# Patient Record
Sex: Female | Born: 1990 | Race: Black or African American | Hispanic: No | Marital: Single | Smoking: Never smoker
Health system: Southern US, Community
[De-identification: ages and names within clinical notes are randomized; demographics above are authoritative.]

## PROBLEM LIST (undated history)

## (undated) ENCOUNTER — Inpatient Hospital Stay (HOSPITAL_COMMUNITY): Payer: Self-pay

## (undated) DIAGNOSIS — A599 Trichomoniasis, unspecified: Secondary | ICD-10-CM

## (undated) DIAGNOSIS — A749 Chlamydial infection, unspecified: Secondary | ICD-10-CM

## (undated) HISTORY — PX: NO PAST SURGERIES: SHX2092

---

## 2011-02-09 ENCOUNTER — Emergency Department (HOSPITAL_COMMUNITY): Payer: Self-pay

## 2011-02-09 ENCOUNTER — Encounter (HOSPITAL_COMMUNITY): Payer: Self-pay | Admitting: *Deleted

## 2011-02-09 ENCOUNTER — Emergency Department (HOSPITAL_COMMUNITY)
Admission: EM | Admit: 2011-02-09 | Discharge: 2011-02-09 | Disposition: A | Discharge status: Home / Self Care | Payer: Self-pay | Attending: Emergency Medicine | Admitting: Emergency Medicine

## 2011-02-09 DIAGNOSIS — O200 Threatened abortion: Secondary | ICD-10-CM | POA: Insufficient documentation

## 2011-02-09 DIAGNOSIS — R109 Unspecified abdominal pain: Secondary | ICD-10-CM | POA: Insufficient documentation

## 2011-02-09 LAB — WET PREP, GENITAL
Clue Cells Wet Prep HPF POC: NONE SEEN
Yeast Wet Prep HPF POC: NONE SEEN

## 2011-02-09 LAB — POCT I-STAT, CHEM 8
BUN: 7 mg/dL (ref 6–23)
Creatinine, Ser: 0.7 mg/dL (ref 0.50–1.10)
Potassium: 3.7 mEq/L (ref 3.5–5.1)
Sodium: 141 mEq/L (ref 135–145)

## 2011-02-09 LAB — URINALYSIS, ROUTINE W REFLEX MICROSCOPIC
Specific Gravity, Urine: 1.031 — ABNORMAL HIGH (ref 1.005–1.030)
Urobilinogen, UA: 1 mg/dL (ref 0.0–1.0)

## 2011-02-09 LAB — URINE MICROSCOPIC-ADD ON

## 2011-02-09 LAB — POCT PREGNANCY, URINE: Preg Test, Ur: POSITIVE — AB

## 2011-02-09 MED ORDER — HYDROCODONE-ACETAMINOPHEN 5-500 MG PO TABS: 1.0000 | ORAL_TABLET | Freq: Four times a day (QID) | ORAL | Status: DC | PRN

## 2011-02-09 NOTE — ED Provider Notes (Signed)
Medical screening examination/treatment/procedure(s) were performed by non-physician practitioner and as supervising physician I was immediately available for consultation/collaboration.   Gerhard Munch, MD 02/09/11 [phone]

## 2011-02-09 NOTE — ED Notes (Signed)
Pt had her last period in early January and then found out that she was pregnant last week by home pregnancy test.  This morning she woke up bleding vaginally, heavier than a period and she was having some menstrual type cramping.  Pt states that now the bleeding is like a regular period.  Pt is in no distress in triage

## 2011-02-09 NOTE — ED Notes (Signed)
NP in for pelvic exam

## 2011-02-09 NOTE — ED Notes (Signed)
Pelvic cart to bedside 

## 2011-02-09 NOTE — ED Provider Notes (Signed)
History     CSN: [id number]  Arrival date & time 02/09/11  1448   First MD Initiated Contact with Patient 02/09/11 1622      Chief Complaint  Patient presents with  . Vaginal Bleeding    (Consider location/radiation/quality/duration/timing/severity/associated sxs/prior treatment) Patient is a 21 y.o. female presenting with vaginal bleeding. The history is provided by the patient.  Vaginal Bleeding This is a new problem. The current episode started today. The problem has been gradually improving. Associated symptoms include abdominal pain. Pertinent negatives include no chills, diaphoresis, fever, nausea, urinary symptoms, vomiting or weakness.  Pt states she did have a period at the beginning of the month, which she states was unusual. States she took a pregnancy test last week, and it was positive. This morning when woke up, she had a large amount of vaginal bleeding and clot like products. States heavier than normal period. States also had lower abdominal cramping. Denies prior pregnancies. States pain now completely resolved, bleeding is still present but slowing down. Denies fever, nausea, vomiting, dizziness.  No past medical history on file.  No past surgical history on file.  No family history on file.  History  Substance Use Topics  . Smoking status: Not on file  . Smokeless tobacco: Not on file  . Alcohol Use: Not on file    OB History    No data available      Review of Systems  Constitutional: Negative for fever, chills and diaphoresis.  HENT: Negative.   Eyes: Negative.   Respiratory: Negative.   Gastrointestinal: Positive for abdominal pain. Negative for nausea, vomiting, diarrhea and constipation.  Genitourinary: Positive for vaginal bleeding and pelvic pain. Negative for dysuria and vaginal pain.  Musculoskeletal: Negative.   Skin: Negative.   Neurological: Negative.  Negative for weakness.  Hematological: Negative.   Psychiatric/Behavioral: Negative.       Allergies  Review of patient's allergies indicates no known allergies.  Home Medications  No current outpatient prescriptions on file.  BP 117/55  Pulse 70  Temp(Src) 98 F (36.7 C) (Oral)  Resp 20  Ht 5' 5.5" (1.664 m)  Wt 132 lb (59.875 kg)  BMI 21.63 kg/m2  SpO2 100%  LMP 01/18/2010  Physical Exam  Nursing note and vitals reviewed. Constitutional: She is oriented to person, place, and time. She appears well-developed and well-nourished. No distress.  HENT:  Head: Normocephalic and atraumatic.  Eyes: Conjunctivae are normal.  Neck: Neck supple.  Cardiovascular: Normal rate and normal heart sounds.   Pulmonary/Chest: Effort normal and breath sounds normal. No respiratory distress.  Abdominal: Soft. Bowel sounds are normal. She exhibits no distension. There is no tenderness.  Genitourinary:       Normal external genetalia. Blood clot  About 5cm in diameter in vaginal canal, moderate bleeding. Cervix closed. Uterus tender to palpation, no adnexal tenderness, no adnexa masses  Musculoskeletal: Normal range of motion. She exhibits no edema.  Neurological: She is alert and oriented to person, place, and time.  Skin: Skin is warm and dry.  Psychiatric: She has a normal mood and affect.    ED Course  Procedures (including critical care time) 5:22 PM Pt with vag bleeding, positive pregnancy. Pelvic exam showed bleeding, clots, cervix closed, no producs of conception. Will get [place] for further evaluation. Pt in NAD. VS normal.   Results for orders placed during the hospital encounter of 02/09/11  URINALYSIS, ROUTINE W REFLEX MICROSCOPIC      Component Value Range   Color, Urine  YELLOW  YELLOW    APPearance CLEAR  CLEAR    Specific Gravity, Urine 1.031 (*) 1.005 - 1.030    pH 5.5  5.0 - 8.0    Glucose, UA NEGATIVE  NEGATIVE (mg/dL)   Hgb urine dipstick LARGE (*) NEGATIVE    Bilirubin Urine SMALL (*) NEGATIVE    Ketones, ur 15 (*) NEGATIVE (mg/dL)   Protein, ur >308 (*)  NEGATIVE (mg/dL)   Urobilinogen, UA 1.0  0.0 - 1.0 (mg/dL)   Nitrite NEGATIVE  NEGATIVE    Leukocytes, UA NEGATIVE  NEGATIVE   WET PREP, GENITAL      Component Value Range   Yeast, Wet Prep NONE SEEN  NONE SEEN    Trich, Wet Prep NONE SEEN  NONE SEEN    Clue Cells, Wet Prep NONE SEEN  NONE SEEN    WBC, Wet Prep HPF POC FEW (*) NONE SEEN   HCG, QUANTITATIVE, PREGNANCY      Component Value Range   hCG, Beta Chain, Quant, S 675 (*) <5 (mIU/mL)  URINE MICROSCOPIC-ADD ON      Component Value Range   Squamous Epithelial / LPF FEW (*) RARE    WBC, UA 0-2  <3 (WBC/hpf)   RBC / HPF 21-50  <3 (RBC/hpf)   Bacteria, UA RARE  RARE    Urine-Other MUCOUS PRESENT    POCT I-STAT, CHEM 8      Component Value Range   Sodium 141  135 - 145 (mEq/L)   Potassium 3.7  3.5 - 5.1 (mEq/L)   Chloride 106  96 - 112 (mEq/L)   BUN 7  6 - 23 (mg/dL)   Creatinine, Ser 6.57  0.50 - 1.10 (mg/dL)   Glucose, Bld 92  70 - 99 (mg/dL)   Calcium, Ion 8.46  9.62 - 1.32 (mmol/L)   TCO2 25  0 - 100 (mmol/L)   Hemoglobin 12.9  12.0 - 15.0 (g/dL)   HCT 95.2  84.1 - 32.4 (%)  ABO/RH      Component Value Range   ABO/RH(D) B POS    POCT PREGNANCY, URINE      Component Value Range   Preg Test, Ur POSITIVE (*) NEGATIVE    US Ob Comp Less 14 Wks  02/09/2011  *RADIOLOGY REPORT*  Clinical Data: Pregnant patient with pain.  OBSTETRIC <14 WK [place] AND TRANSVAGINAL OB US  Technique:  Both transabdominal and transvaginal ultrasound examinations were performed for complete evaluation of the gestation as well as the maternal uterus, adnexal regions, and pelvic cul-de-sac.  Transvaginal technique was performed to assess early pregnancy.  Comparison:  None.  Intrauterine gestational sac:  There is a large volume of echogenic material within the endometrial canal consistent with hemorrhage. In the lower uterine segment, there appears to be a more focal area of increased attenuation which may represent a gestational sac.  No evidence of  viable intrauterine pregnancy is identified.  Maternal uterus/adnexae: The right ovary measures 4.1 by 3.8 x 2.1 cm.  A focal collection within the right ovary measures 2.4 x 1.4 x 2.1 cm.  The left ovary measures 4.8 x 3.6 x 3.7 cm.  There is a focal lesion within the left ovary measuring 2.7 x 3.9 by 3.2 cm with multiple lacy internal echoes. There is a small volume of free pelvic fluid.  IMPRESSION:  1.  Findings most consistent with abortion in progress with a large volume of clot seen in the uterus. Early intrauterine pregnancy is identified. 2.  Cystic lesion  in the left ovary has an appearance consistent with a hemorrhagic cyst. 3.  Nonspecific lesion in the right ovary may also be a hemorrhagic cyst.  Repeat ultrasound in 6-8 weeks is recommended for further evaluation.  Original Report Authenticated By: Bernadene Bell. D'ALESSIO, M.D.   US Ob Transvaginal  02/09/2011  *RADIOLOGY REPORT*  Clinical Data: Pregnant patient with pain.  OBSTETRIC <14 WK [place] AND TRANSVAGINAL OB US  Technique:  Both transabdominal and transvaginal ultrasound examinations were performed for complete evaluation of the gestation as well as the maternal uterus, adnexal regions, and pelvic cul-de-sac.  Transvaginal technique was performed to assess early pregnancy.  Comparison:  None.  Intrauterine gestational sac:  There is a large volume of echogenic material within the endometrial canal consistent with hemorrhage. In the lower uterine segment, there appears to be a more focal area of increased attenuation which may represent a gestational sac.  No evidence of viable intrauterine pregnancy is identified.  Maternal uterus/adnexae: The right ovary measures 4.1 by 3.8 x 2.1 cm.  A focal collection within the right ovary measures 2.4 x 1.4 x 2.1 cm.  The left ovary measures 4.8 x 3.6 x 3.7 cm.  There is a focal lesion within the left ovary measuring 2.7 x 3.9 by 3.2 cm with multiple lacy internal echoes. There is a small volume of free pelvic  fluid.  IMPRESSION:  1.  Findings most consistent with abortion in progress with a large volume of clot seen in the uterus. Early intrauterine pregnancy is identified. 2.  Cystic lesion in the left ovary has an appearance consistent with a hemorrhagic cyst. 3.  Nonspecific lesion in the right ovary may also be a hemorrhagic cyst.  Repeat ultrasound in 6-8 weeks is recommended for further evaluation.  Original Report Authenticated By: Bernadene Bell. D'ALESSIO, M.D.     6:49 PM Pt with possible threatened abortion. Spoke with Dr. Billy Coast, recommended adding Type and RH, and following up on Monday at [facility] for HCG recheck. Pt is currently stable, with normal vs, nad, no abdominal pain at present. OK to d/c home.  8:35 PM Blood type B pos, no rhogam given. Will d/c home with close follow up.   No diagnosis found.    MDM          Lottie Mussel, PA 02/09/11 2035

## 2011-02-09 NOTE — ED Notes (Signed)
Patient was given written d/c instruction. Left ambulatory steady gait with family/friends.  Understands follow up instruction

## 2011-02-10 LAB — GC/CHLAMYDIA PROBE AMP, GENITAL: Chlamydia, DNA Probe: NEGATIVE

## 2011-02-10 MED ORDER — LIDOCAINE HCL (CARDIAC) 20 MG/ML IV SOLN
INTRAVENOUS | Status: AC
  Filled 2011-02-10: qty 5

## 2011-02-10 MED ORDER — ETOMIDATE 2 MG/ML IV SOLN
INTRAVENOUS | Status: AC
  Filled 2011-02-10: qty 20

## 2011-02-10 MED ORDER — SUCCINYLCHOLINE CHLORIDE 20 MG/ML IJ SOLN
INTRAMUSCULAR | Status: AC
  Filled 2011-02-10: qty 10

## 2011-02-10 MED ORDER — ROCURONIUM BROMIDE 50 MG/5ML IV SOLN
INTRAVENOUS | Status: AC
  Filled 2011-02-10: qty 2

## 2011-02-13 ENCOUNTER — Inpatient Hospital Stay (HOSPITAL_COMMUNITY)
Admission: AD | Admit: 2011-02-13 | Discharge: 2011-02-13 | Disposition: A | Discharge status: Home / Self Care | Payer: Self-pay | Source: Ambulatory Visit | Attending: Obstetrics & Gynecology | Admitting: Obstetrics & Gynecology

## 2011-02-13 ENCOUNTER — Telehealth: Payer: Self-pay | Admitting: *Deleted

## 2011-02-13 ENCOUNTER — Encounter (HOSPITAL_COMMUNITY): Payer: Self-pay

## 2011-02-13 DIAGNOSIS — O039 Complete or unspecified spontaneous abortion without complication: Secondary | ICD-10-CM | POA: Insufficient documentation

## 2011-02-13 HISTORY — DX: Chlamydial infection, unspecified: A74.9

## 2011-02-13 HISTORY — DX: Trichomoniasis, unspecified: A59.9

## 2011-02-13 LAB — HCG, QUANTITATIVE, PREGNANCY: hCG, Beta Chain, Quant, S: 120 m[IU]/mL — ABNORMAL HIGH (ref <5)

## 2011-02-13 MED ORDER — IBUPROFEN 600 MG PO TABS: 600.0000 mg | ORAL_TABLET | Freq: Four times a day (QID) | ORAL | Status: AC | PRN

## 2011-02-13 NOTE — Progress Notes (Signed)
Patient states she was seen at [facility] ED on 1-24 and was instructed to follow up in [facility] if bleeding continued. States she is having a scant amount of brownish bleeding but no pain.

## 2011-02-13 NOTE — Telephone Encounter (Signed)
Spoke with patient. She is coming on 02/27/11 for lab at 3 pm.

## 2011-02-13 NOTE — ED Provider Notes (Signed)
History    G1 at 4 weeks by LMP presents to [facility] with brown spotting when she wipes.  She presented to [facility] with bright red vaginal bleeding on 02/09/11 and had a PPT and had u/s at that time showing possible miscarriage.  She was told to come to women's in a few days for f/u.   Chief Complaint  Patient presents with  . Follow-up   HPI  OB History    Grav Para Term Preterm Abortions TAB SAB Ect Mult Living   1               No past medical history on file.  No past surgical history on file.  No family history on file.  History  Substance Use Topics  . Smoking status: Never Smoker   . Smokeless tobacco: Not on file  . Alcohol Use: No    Allergies: No Known Allergies  Prescriptions prior to admission  Medication Sig Dispense Refill  . HYDROcodone-acetaminophen (VICODIN) 5-500 MG per tablet Take 1-2 tablets by mouth every 6 (six) hours as needed for pain.  15 tablet  0    Review of Systems  Constitutional: Negative for malaise/fatigue.  Gastrointestinal: Positive for abdominal pain.  Neurological: Negative for dizziness.   Physical Exam   Blood pressure 116/63, pulse 63, temperature 97.9 F (36.6 C), temperature source Oral, resp. rate 16, height 5' 3.75" (1.619 m), weight 60.963 kg (134 lb 6.4 oz), last menstrual period 01/18/2010, SpO2 97.00%.  Physical Exam  Constitutional: She is oriented to person, place, and time. She appears well-developed and well-nourished.  Respiratory: Effort normal.  GI: Soft.  Neurological: She is alert and oriented to person, place, and time. She has normal reflexes.  Skin: Skin is warm and dry.  Psychiatric: She has a normal mood and affect. Her behavior is normal. Judgment and thought content normal.   Results for recent visit to [facility] and today's visit:   Recent Results (from the past 168 hour(s))  URINALYSIS, ROUTINE W REFLEX MICROSCOPIC   Collection Time   02/09/11  4:49 PM      Component Value Range   Color, Urine  YELLOW  YELLOW    APPearance CLEAR  CLEAR    Specific Gravity, Urine 1.031 (*) 1.005 - 1.030    pH 5.5  5.0 - 8.0    Glucose, UA NEGATIVE  NEGATIVE (mg/dL)   Hgb urine dipstick LARGE (*) NEGATIVE    Bilirubin Urine SMALL (*) NEGATIVE    Ketones, ur 15 (*) NEGATIVE (mg/dL)   Protein, ur >409 (*) NEGATIVE (mg/dL)   Urobilinogen, UA 1.0  0.0 - 1.0 (mg/dL)   Nitrite NEGATIVE  NEGATIVE    Leukocytes, UA NEGATIVE  NEGATIVE   URINE MICROSCOPIC-ADD ON   Collection Time   02/09/11  4:49 PM      Component Value Range   Squamous Epithelial / LPF FEW (*) RARE    WBC, UA 0-2  <3 (WBC/hpf)   RBC / HPF 21-50  <3 (RBC/hpf)   Bacteria, UA RARE  RARE    Urine-Other MUCOUS PRESENT    POCT PREGNANCY, URINE   Collection Time   02/09/11  4:55 PM      Component Value Range   Preg Test, Ur POSITIVE (*) NEGATIVE   GC/CHLAMYDIA PROBE AMP, GENITAL   Collection Time   02/09/11  5:14 PM      Component Value Range   GC Probe Amp, Genital NEGATIVE  NEGATIVE    Chlamydia, DNA  Probe NEGATIVE  NEGATIVE   WET PREP, GENITAL   Collection Time   02/09/11  5:14 PM      Component Value Range   Yeast, Wet Prep NONE SEEN  NONE SEEN    Trich, Wet Prep NONE SEEN  NONE SEEN    Clue Cells, Wet Prep NONE SEEN  NONE SEEN    WBC, Wet Prep HPF POC FEW (*) NONE SEEN   HCG, QUANTITATIVE, PREGNANCY   Collection Time   02/09/11  5:33 PM      Component Value Range   hCG, Beta Chain, Quant, S 675 (*) <5 (mIU/mL)  POCT I-STAT, CHEM 8   Collection Time   02/09/11  5:50 PM      Component Value Range   Sodium 141  135 - 145 (mEq/L)   Potassium 3.7  3.5 - 5.1 (mEq/L)   Chloride 106  96 - 112 (mEq/L)   BUN 7  6 - 23 (mg/dL)   Creatinine, Ser 1.61  0.50 - 1.10 (mg/dL)   Glucose, Bld 92  70 - 99 (mg/dL)   Calcium, Ion 0.96  0.45 - 1.32 (mmol/L)   TCO2 25  0 - 100 (mmol/L)   Hemoglobin 12.9  12.0 - 15.0 (g/dL)   HCT 40.9  81.1 - 91.4 (%)  ABO/RH   Collection Time   02/09/11  7:53 PM      Component Value Range   ABO/RH(D) B  POS    HCG, QUANTITATIVE, PREGNANCY   Collection Time   02/13/11 10:33 AM      Component Value Range   hCG, Beta Chain, Quant, S 120 (*) <5 (mIU/mL)    MAU Course  Procedures Quantitative hCG   Assessment and Plan  A: Probable spontaneous abortion in early pregnancy  P: D/C home with bleeding precautions Ibuprofen 600 mg PO Q6-8 hours for pain/bleeding Gyn clinic to contact pt with appointment in 2 weeks for f/u quantitative hCG  LEFTWICH-KIRBY, [place] 02/13/2011, 10:29 AM

## 2011-02-13 NOTE — Telephone Encounter (Signed)
Message copied by Mannie Stabile on Mon Feb 13, 2011  1:25 PM ------      Message from: LEFTWICH-KIRBY, LISA A      Created: Mon Feb 13, 2011 11:54 AM      Regarding: Needs f/u QhCG       Please call pt for appointment in 2 weeks for repeat quantitative hCG for probable miscarriage vs early ectopic.

## 2011-02-27 ENCOUNTER — Other Ambulatory Visit: Payer: Self-pay

## 2012-03-16 ENCOUNTER — Emergency Department (HOSPITAL_COMMUNITY)
Admission: EM | Admit: 2012-03-16 | Discharge: 2012-03-16 | Disposition: A | Discharge status: Other Acute Inpatient Hospital | Payer: Self-pay

## 2012-03-16 ENCOUNTER — Emergency Department (HOSPITAL_COMMUNITY)
Admission: EM | Admit: 2012-03-16 | Discharge: 2012-03-16 | Disposition: A | Discharge status: Home / Self Care | Payer: BC Managed Care – PPO | Attending: Emergency Medicine | Admitting: Emergency Medicine

## 2012-03-16 ENCOUNTER — Encounter (HOSPITAL_COMMUNITY): Payer: Self-pay | Admitting: Nurse Practitioner

## 2012-03-16 DIAGNOSIS — Z8619 Personal history of other infectious and parasitic diseases: Secondary | ICD-10-CM | POA: Insufficient documentation

## 2012-03-16 DIAGNOSIS — Z8709 Personal history of other diseases of the respiratory system: Secondary | ICD-10-CM | POA: Insufficient documentation

## 2012-03-16 DIAGNOSIS — O98919 Unspecified maternal infectious and parasitic disease complicating pregnancy, unspecified trimester: Secondary | ICD-10-CM | POA: Insufficient documentation

## 2012-03-16 DIAGNOSIS — O209 Hemorrhage in early pregnancy, unspecified: Secondary | ICD-10-CM | POA: Insufficient documentation

## 2012-03-16 DIAGNOSIS — N760 Acute vaginitis: Secondary | ICD-10-CM | POA: Insufficient documentation

## 2012-03-16 DIAGNOSIS — Z8742 Personal history of other diseases of the female genital tract: Secondary | ICD-10-CM | POA: Insufficient documentation

## 2012-03-16 DIAGNOSIS — B9689 Other specified bacterial agents as the cause of diseases classified elsewhere: Secondary | ICD-10-CM | POA: Insufficient documentation

## 2012-03-16 DIAGNOSIS — A499 Bacterial infection, unspecified: Secondary | ICD-10-CM | POA: Insufficient documentation

## 2012-03-16 DIAGNOSIS — Z79899 Other long term (current) drug therapy: Secondary | ICD-10-CM | POA: Insufficient documentation

## 2012-03-16 LAB — PREGNANCY, URINE: Preg Test, Ur: POSITIVE — AB

## 2012-03-16 LAB — URINE MICROSCOPIC-ADD ON

## 2012-03-16 LAB — URINALYSIS, ROUTINE W REFLEX MICROSCOPIC
Ketones, ur: 15 mg/dL — AB
Nitrite: NEGATIVE
Protein, ur: NEGATIVE mg/dL

## 2012-03-16 LAB — WET PREP, GENITAL: Yeast Wet Prep HPF POC: NONE SEEN

## 2012-03-16 MED ORDER — METRONIDAZOLE 500 MG PO TABS
500.0000 mg | ORAL_TABLET | Freq: Once | ORAL | Status: DC
  Filled 2012-03-16: qty 1

## 2012-03-16 MED ORDER — METRONIDAZOLE 500 MG PO TABS: 500.0000 mg | ORAL_TABLET | Freq: Two times a day (BID) | ORAL | Status: DC

## 2012-03-16 NOTE — ED Provider Notes (Signed)
History     CSN: [id number]  Arrival date & time 03/16/12  1411   First MD Initiated Contact with Patient 03/16/12 1506      Chief Complaint  Patient presents with  . Vaginal Discharge    (Consider location/radiation/quality/duration/timing/severity/associated sxs/prior treatment) HPI  The patient presents with concern of a vaginal discharge.  She had a positive pregnancy test 3 days ago, last menstrual period was one month ago.  She states approximately 3 days ago she started having vaginal bleeding, which has decreased in the interval days.  She continues to have mild discharge/brown blood, but no vaginal pain, no abdominal pain, no lightheadedness, nausea, no vomiting, no diarrhea. This is her third pregnancy.  No complete pregnancies thus far. She is generally healthy.  Past Medical History  Diagnosis Date  . Asthma     childhood  . Trichomonas   . Chlamydia     History reviewed. No pertinent past surgical history.  History reviewed. No pertinent family history.  History  Substance Use Topics  . Smoking status: Never Smoker   . Smokeless tobacco: Never Used  . Alcohol Use: 5.4 oz/week    5 Glasses of wine, 4 Shots of liquor per week     Comment: Every other day and on most weekends    OB History   Grav Para Term Preterm Abortions TAB SAB Ect Mult Living   1               Review of Systems  Constitutional:       Per HPI, otherwise negative  HENT:       Per HPI, otherwise negative  Respiratory:       Per HPI, otherwise negative  Cardiovascular:       Per HPI, otherwise negative  Gastrointestinal: Negative for vomiting.  Endocrine:       Negative aside from HPI  Genitourinary:       Neg aside from HPI   Musculoskeletal:       Per HPI, otherwise negative  Skin: Negative.   Neurological: Negative for syncope.    Allergies  Review of patient's allergies indicates no known allergies.  Home Medications   Current Outpatient Rx  Name  Route  Sig   Dispense  Refill  . ibuprofen (ADVIL,MOTRIN) 200 MG tablet   Oral   Take 400 mg by mouth every 6 (six) hours as needed for pain (for headache.).         Marland Kitchen norgestimate-ethinyl estradiol (ORTHO-CYCLEN,SPRINTEC,PREVIFEM) 0.25-35 MG-MCG tablet   Oral   Take 1 tablet by mouth daily.         Marland Kitchen PRESCRIPTION MEDICATION   Oral   Take 1 tablet by mouth.         . Pseudoephedrine-APAP-DM (DAYQUIL PO)   Oral   Take 2 capsules by mouth once.           BP 127/72  Pulse 67  Temp(Src) 98.7 F (37.1 C) (Oral)  Resp 16  SpO2 98%  Physical Exam  Nursing note and vitals reviewed. Constitutional: She is oriented to person, place, and time. She appears well-developed and well-nourished. No distress.  HENT:  Head: Normocephalic and atraumatic.  Eyes: Conjunctivae and EOM are normal.  Cardiovascular: Normal rate and regular rhythm.   Pulmonary/Chest: Effort normal and breath sounds normal. No stridor. No respiratory distress.  Abdominal: She exhibits no distension.  Musculoskeletal: She exhibits no edema.  Neurological: She is alert and oriented to person, place, and time. No cranial  nerve deficit.  Skin: Skin is warm and dry.  Psychiatric: She has a normal mood and affect.    ED Course  Pelvic exam Date/Time: 03/16/2012 3:58 PM Performed by: Gerhard Munch Authorized by: Gerhard Munch Consent: Verbal consent obtained. The procedure was performed in an emergent situation. Risks and benefits: risks, benefits and alternatives were discussed Consent given by: patient Patient understanding: patient states understanding of the procedure being performed Patient consent: the patient's understanding of the procedure matches consent given Procedure consent: procedure consent matches procedure scheduled Relevant documents: relevant documents present and verified Test results: test results available and properly labeled Site marked: the operative site was marked Imaging studies:  imaging studies available Required items: required blood products, implants, devices, and special equipment available Patient identity confirmed: verbally with patient Time out: Immediately prior to procedure a "time out" was called to verify the correct patient, procedure, equipment, support staff and site/side marked as required. Preparation: Patient was prepped and draped in the usual sterile fashion. Local anesthesia used: no Patient sedated: no Patient tolerance: Patient tolerated the procedure well with no immediate complications.   (including critical care time)  Labs Reviewed  PREGNANCY, URINE - Abnormal; Notable for the following:    Preg Test, Ur POSITIVE (*)    All other components within normal limits  WET PREP, GENITAL  GC/CHLAMYDIA PROBE AMP  URINALYSIS, ROUTINE W REFLEX MICROSCOPIC   No results found.   No diagnosis found.  6:25 PM After significant the leg, with some labs pending, the patient was discharged, following a discussion of the need to obtain these results, particularly with concern for possible effects on pregnancy  MDM  This is a G3 P1 at approximately 5 weeks presents with minor vaginal bleeding, painless.  On exam the patient has unremarkable vital signs.  The patient has atrial vaginosis, and on discharge gonorrhea/chlymidia are pending.  The patient was counseled on the need for close obstetrics followup, discharged in stable condition.  Absent abdominal pain, hypertension, there is little suspicion for ectopic.        Gerhard Munch, MD 03/16/12 [phone]

## 2012-03-16 NOTE — ED Notes (Signed)
Pt waiting for meds from main pharmacy.

## 2012-03-16 NOTE — ED Notes (Signed)
States she took 2 positive pregnancy tests at home on Sunday then started to have brown spotting and vag discharge throughout this week. No pain but does reports some nausea

## 2012-03-23 ENCOUNTER — Telehealth (HOSPITAL_COMMUNITY): Payer: Self-pay | Admitting: Emergency Medicine

## 2012-03-24 ENCOUNTER — Telehealth (HOSPITAL_COMMUNITY): Payer: Self-pay | Admitting: Emergency Medicine

## 2012-03-24 NOTE — ED Notes (Signed)
Rx called in to [facility] on [address] by Shannon Gammons PFM. °

## 2012-03-28 ENCOUNTER — Emergency Department (HOSPITAL_COMMUNITY)
Admission: EM | Admit: 2012-03-28 | Discharge: 2012-03-28 | Discharge status: Against Medical Advice | Payer: BC Managed Care – PPO | Attending: Emergency Medicine | Admitting: Emergency Medicine

## 2012-03-28 ENCOUNTER — Encounter (HOSPITAL_COMMUNITY): Payer: Self-pay | Admitting: Cardiology

## 2012-03-28 DIAGNOSIS — Z3200 Encounter for pregnancy test, result unknown: Secondary | ICD-10-CM | POA: Insufficient documentation

## 2012-03-28 DIAGNOSIS — R109 Unspecified abdominal pain: Secondary | ICD-10-CM | POA: Insufficient documentation

## 2012-03-28 DIAGNOSIS — R6883 Chills (without fever): Secondary | ICD-10-CM | POA: Insufficient documentation

## 2012-03-28 DIAGNOSIS — R42 Dizziness and giddiness: Secondary | ICD-10-CM | POA: Insufficient documentation

## 2012-03-28 NOTE — ED Notes (Signed)
Pt reports she's had some dizziness and chills over the past week. States he took a pregnancy test about 3 weeks ago and was positive but does not know how far along she is. Denies any pain at this time, just discomfort in her abd. Reports some spotting a couple of days ago, but none now.

## 2012-05-10 ENCOUNTER — Emergency Department (HOSPITAL_COMMUNITY)
Admission: EM | Admit: 2012-05-10 | Discharge: 2012-05-10 | Disposition: A | Discharge status: Home / Self Care | Payer: BC Managed Care – PPO | Attending: Emergency Medicine | Admitting: Emergency Medicine

## 2012-05-10 ENCOUNTER — Emergency Department (HOSPITAL_COMMUNITY): Payer: BC Managed Care – PPO

## 2012-05-10 ENCOUNTER — Encounter (HOSPITAL_COMMUNITY): Payer: Self-pay | Admitting: Emergency Medicine

## 2012-05-10 DIAGNOSIS — J45909 Unspecified asthma, uncomplicated: Secondary | ICD-10-CM | POA: Insufficient documentation

## 2012-05-10 DIAGNOSIS — O9989 Other specified diseases and conditions complicating pregnancy, childbirth and the puerperium: Secondary | ICD-10-CM | POA: Insufficient documentation

## 2012-05-10 DIAGNOSIS — Z8619 Personal history of other infectious and parasitic diseases: Secondary | ICD-10-CM | POA: Insufficient documentation

## 2012-05-10 DIAGNOSIS — R109 Unspecified abdominal pain: Secondary | ICD-10-CM | POA: Insufficient documentation

## 2012-05-10 DIAGNOSIS — O200 Threatened abortion: Secondary | ICD-10-CM

## 2012-05-10 LAB — URINALYSIS, ROUTINE W REFLEX MICROSCOPIC
Glucose, UA: NEGATIVE mg/dL
Nitrite: NEGATIVE
Specific Gravity, Urine: 1.03 (ref 1.005–1.030)
pH: 5.5 (ref 5.0–8.0)

## 2012-05-10 LAB — CBC WITH DIFFERENTIAL/PLATELET
HCT: 32.9 % — ABNORMAL LOW (ref 36.0–46.0)
Hemoglobin: 11.1 g/dL — ABNORMAL LOW (ref 12.0–15.0)
Lymphocytes Relative: 38 % (ref 12–46)
Lymphs Abs: 2.3 10*3/uL (ref 0.7–4.0)
Monocytes Relative: 4 % (ref 3–12)
Neutro Abs: 3.3 10*3/uL (ref 1.7–7.7)
Neutrophils Relative %: 55 % (ref 43–77)
RBC: 4.17 MIL/uL (ref 3.87–5.11)

## 2012-05-10 LAB — BASIC METABOLIC PANEL
BUN: 7 mg/dL (ref 6–23)
CO2: 25 mEq/L (ref 19–32)
Chloride: 103 mEq/L (ref 96–112)
GFR calc Af Amer: >90 mL/min (ref >90)
Glucose, Bld: 76 mg/dL (ref 70–99)
Potassium: 3.6 mEq/L (ref 3.5–5.1)

## 2012-05-10 LAB — URINE MICROSCOPIC-ADD ON

## 2012-05-10 LAB — WET PREP, GENITAL
WBC, Wet Prep HPF POC: NONE SEEN
Yeast Wet Prep HPF POC: NONE SEEN

## 2012-05-10 LAB — HCG, QUANTITATIVE, PREGNANCY: hCG, Beta Chain, Quant, S: 62 m[IU]/mL — ABNORMAL HIGH (ref <5)

## 2012-05-10 NOTE — ED Notes (Signed)
Pt reports took abortion pill about 3-4 weeks ago. Pt c/o heavy vaginal bleeding with nausea, abdominal pain x 1 week.

## 2012-05-10 NOTE — ED Notes (Signed)
Transported to [place].

## 2012-05-10 NOTE — ED Provider Notes (Signed)
History     CSN: [id number]  Arrival date & time 05/10/12  9562   First MD Initiated Contact with Patient 05/10/12 1002      Chief Complaint  Patient presents with  . Vaginal Bleeding  . Abdominal Pain    (Consider location/radiation/quality/duration/timing/severity/associated sxs/prior treatment) HPI  22 year old female presents the emergency department chief complaint of vaginal bleeding.  Patient states she had a positive pregnancy test 2 months ago.  Her last menstrual period she states was sometime in early January.  The patient took "abortion pill"  approximately one month ago.  Patient states she does not know if it was RU 486.  The patient states she had heavy bleeding and cramping for approximately one week her bleeding and cramping then resolved and she has had no bleeding since that time.  He is started on Loestrin oral contraceptive pill.  She states she has been taking it but she did miss a couple of days.  The patient has continued unprotected sex with her partner.  Her urine pregnancy today is positive.  Patient states that for the past week she has had cramping and heavy vaginal bleeding.  Last night she passed a large clot.  She denies any current pain however she continues to bleed.  She denies any urinary symptoms.  She denies any discharge from her vagina pain with sexual intercourse foul odor or itching. Patient denies any fevers, chills, vomiting.  She has had some associated nausea which is mild.  Past Medical History  Diagnosis Date  . Asthma     childhood  . Trichomonas   . Chlamydia     History reviewed. No pertinent past surgical history.  No family history on file.  History  Substance Use Topics  . Smoking status: Never Smoker   . Smokeless tobacco: Never Used  . Alcohol Use: 5.4 oz/week    5 Glasses of wine, 4 Shots of liquor per week     Comment: Every other day and on most weekends    OB History   Grav Para Term Preterm Abortions TAB SAB Ect  Mult Living   1               Review of Systems Ten systems reviewed and are negative for acute change, except as noted in the HPI.   Allergies  Review of patient's allergies indicates no known allergies.  Home Medications   Current Outpatient Rx  Name  Route  Sig  Dispense  Refill  . ibuprofen (ADVIL,MOTRIN) 200 MG tablet   Oral   Take 600 mg by mouth every 6 (six) hours as needed for pain.            BP 120/61  Pulse 67  Temp(Src) 98 F (36.7 C)  Resp 18  SpO2 100%  LMP 02/10/2012  Breastfeeding? Unknown  Physical Exam Physical Exam  Nursing note and vitals reviewed. Constitutional: She is oriented to person, place, and time. She appears well-developed and well-nourished. No distress.  HENT:  Head: Normocephalic and atraumatic.  Eyes: Conjunctivae normal and EOM are normal. Pupils are equal, round, and reactive to light. No scleral icterus.  Neck: Normal range of motion.  Cardiovascular: Normal rate, regular rhythm and normal heart sounds.  Exam reveals no gallop and no friction rub.   No murmur heard. Pulmonary/Chest: Effort normal and breath sounds normal. No respiratory distress.  Abdominal: Soft. Bowel sounds are normal. She exhibits no distension and no mass. There is no tenderness. There is no  guarding.  Neurological: She is alert and oriented to person, place, and time.  Skin: Skin is warm and dry. She is not diaphoretic.  Pelvic exam: VULVA: normal appearing vulva with no masses, tenderness or lesions, VAGINA: normal appearing vagina with normal color and discharge, no lesions, CERVIX:  Bleeding from the os. No CMT no adnexal tenderness or fullness, WET MOUNT done - results: negative for pathogens, normal epithelial cells.   ED Course  Procedures (including critical care time)  Labs Reviewed  CBC WITH DIFFERENTIAL - Abnormal; Notable for the following:    Hemoglobin 11.1 (*)    HCT 32.9 (*)    All other components within normal limits  URINALYSIS,  ROUTINE W REFLEX MICROSCOPIC - Abnormal; Notable for the following:    Color, Urine AMBER (*)    APPearance CLOUDY (*)    Hgb urine dipstick LARGE (*)    Bilirubin Urine SMALL (*)    Ketones, ur 15 (*)    Protein, ur 30 (*)    Leukocytes, UA SMALL (*)    All other components within normal limits  URINE MICROSCOPIC-ADD ON - Abnormal; Notable for the following:    Squamous Epithelial / LPF FEW (*)    All other components within normal limits  HCG, QUANTITATIVE, PREGNANCY - Abnormal; Notable for the following:    hCG, Beta Chain, Quant, S 62 (*)    All other components within normal limits  POCT PREGNANCY, URINE - Abnormal; Notable for the following:    Preg Test, Ur POSITIVE (*)    All other components within normal limits  GC/CHLAMYDIA PROBE AMP  WET PREP, GENITAL  URINE CULTURE  BASIC METABOLIC PANEL   US Ob Comp Less 14 Wks  05/10/2012  *RADIOLOGY REPORT*  Clinical Data: Vaginal bleeding  OBSTETRIC <14 WK [place] AND TRANSVAGINAL OB US  Technique:  Both transabdominal and transvaginal ultrasound examinations were performed for complete evaluation of the gestation as well as the maternal uterus, adnexal regions, and pelvic cul-de-sac.  Transvaginal technique was performed to assess early pregnancy.  Comparison:  None.  Intrauterine gestational sac:  No gestational sac is identified. The endometrium within the fundus is somewhat heterogeneous.  There is trace amount of fluid within the endometrial canal of the lower uterus and cervix. Color flow vascular imaging of the heterogeneous endometrium demonstrates no vascularity to suggest retained products of conception. Yolk sac: Not applicable. Embryo: Not applicable. Cardiac Activity: Not applicable. Heart Rate: Not applicable. bpm  Maternal uterus/adnexae: Ovaries are within normal limits.  No free fluid.  IMPRESSION: There is no gestational sac.  The endometrium is heterogeneous and there is a small amount of fluid within the lower endometrium and  cervix.  With a very low beta HCG level, this likely represents a spontaneous abortion, or early pregnancy.  Ectopic pregnancy is not entirely excluded.  Serial beta HCG levels are warranted.  Follow- up ultrasound can be performed at 1 week if a developing embryo is suspected.   Original Report Authenticated By: Jolaine Click, M.D.    US Ob Transvaginal  05/10/2012  *RADIOLOGY REPORT*  Clinical Data: Vaginal bleeding  OBSTETRIC <14 WK [place] AND TRANSVAGINAL OB US  Technique:  Both transabdominal and transvaginal ultrasound examinations were performed for complete evaluation of the gestation as well as the maternal uterus, adnexal regions, and pelvic cul-de-sac.  Transvaginal technique was performed to assess early pregnancy.  Comparison:  None.  Intrauterine gestational sac:  No gestational sac is identified. The endometrium within the fundus is somewhat  heterogeneous.  There is trace amount of fluid within the endometrial canal of the lower uterus and cervix. Color flow vascular imaging of the heterogeneous endometrium demonstrates no vascularity to suggest retained products of conception. Yolk sac: Not applicable. Embryo: Not applicable. Cardiac Activity: Not applicable. Heart Rate: Not applicable. bpm  Maternal uterus/adnexae: Ovaries are within normal limits.  No free fluid.  IMPRESSION: There is no gestational sac.  The endometrium is heterogeneous and there is a small amount of fluid within the lower endometrium and cervix.  With a very low beta HCG level, this likely represents a spontaneous abortion, or early pregnancy.  Ectopic pregnancy is not entirely excluded.  Serial beta HCG levels are warranted.  Follow- up ultrasound can be performed at 1 week if a developing embryo is suspected.   Original Report Authenticated By: Jolaine Click, M.D.      1. Threatened abortion       MDM  Patient with Pos preg test. Bleeding form os. US shows fluid in the uterus and no gestational sack. I believe this is  likely a new pregnancy  And inevitable abortion as patient continued to have unprootected sex after missing doses of her OCP and due to the fact that she has a very low B-HCG.  Ectopic cannot be r/o on [place]. Patient is to follow up at the [facility] hospital for repeat US and serial B HCG.  Patient informed of risks of not following up and given explicit return precautions. The patient appears reasonably screened and/or stabilized for discharge and I doubt any other medical condition or other [facility] requiring further screening, evaluation, or treatment in the ED at this time prior to discharge.                                   Arthor Captain, PA-C 05/12/12 0830

## 2012-05-11 LAB — GC/CHLAMYDIA PROBE AMP: GC Probe RNA: NEGATIVE

## 2012-05-11 LAB — URINE CULTURE: Colony Count: 9000

## 2012-05-12 NOTE — ED Provider Notes (Signed)
Medical screening examination/treatment/procedure(s) were performed by non-physician practitioner and as supervising physician I was immediately available for consultation/collaboration.    Vida Roller, MD 05/12/12 1105

## 2012-05-13 ENCOUNTER — Inpatient Hospital Stay (HOSPITAL_COMMUNITY)
Admission: AD | Admit: 2012-05-13 | Discharge: 2012-05-13 | Disposition: A | Discharge status: Home / Self Care | Payer: BC Managed Care – PPO | Source: Ambulatory Visit | Attending: Obstetrics and Gynecology | Admitting: Obstetrics and Gynecology

## 2012-05-13 DIAGNOSIS — Z332 Encounter for elective termination of pregnancy: Secondary | ICD-10-CM | POA: Insufficient documentation

## 2012-05-13 DIAGNOSIS — O039 Complete or unspecified spontaneous abortion without complication: Secondary | ICD-10-CM

## 2012-05-13 NOTE — MAU Provider Note (Signed)
History     CSN: [id number]  Arrival date and time: 05/13/12 1559   None     Chief Complaint  Patient presents with  . Threatened Miscarriage   HPI 22 y.o. G1P0010 here for follow up quant. Seen at [facility] on 4/25 with vaginal bleeding, quant of 65 and nothing seen on u/s. Had taken "abortion pill" 4 weeks prior, bled x 1 week, then started bleeding heavy again last week. No pain or bleeding today.   Nurse's note:  Took abortion pill about 4 wks ago, bled for about a wk, then every thing stopped. Started bleeding beginning of last wk- started bleeding heavy, passed a lot of clots and "something I didn't know what it was" so that is when I went to the hospital. Was at [facility] on 04/25- had pos preg test and blood test, was told to follow up here   Past Medical History  Diagnosis Date  . Asthma     childhood  . Trichomonas   . Chlamydia     No past surgical history on file.  No family history on file.  History  Substance Use Topics  . Smoking status: Never Smoker   . Smokeless tobacco: Never Used  . Alcohol Use: 5.4 oz/week    5 Glasses of wine, 4 Shots of liquor per week     Comment: Every other day and on most weekends    Allergies: No Known Allergies  Prescriptions prior to admission  Medication Sig Dispense Refill  . ibuprofen (ADVIL,MOTRIN) 200 MG tablet Take 600 mg by mouth every 6 (six) hours as needed for pain.         Review of Systems  Constitutional: Negative.   Respiratory: Negative.   Cardiovascular: Negative.   Gastrointestinal: Negative for nausea, vomiting, abdominal pain, diarrhea and constipation.  Genitourinary: Negative for dysuria, urgency, frequency, hematuria and flank pain.       Negative for vaginal bleeding, vaginal discharge, dyspareunia  Musculoskeletal: Negative.   Neurological: Negative.   Psychiatric/Behavioral: Negative.    Physical Exam   Blood pressure 126/61, pulse 57, temperature 98.3 F (36.8 C), resp. rate 18, height 5' 4.5"  (1.638 m), weight 146 lb (66.225 kg), last menstrual period 02/10/2012.  Physical Exam  Nursing note and vitals reviewed. Constitutional: She is oriented to person, place, and time. She appears well-developed and well-nourished. No distress.  Musculoskeletal: Normal range of motion.  Neurological: She is alert and oriented to person, place, and time.  Skin: Skin is warm and dry.  Psychiatric: She has a normal mood and affect.    MAU Course  Procedures  Results for orders placed during the hospital encounter of 05/13/12 (from the past 72 hour(s))  HCG, QUANTITATIVE, PREGNANCY     Status: Abnormal   Collection Time    05/13/12  4:33 PM      Result Value Range   hCG, Beta Chain, Quant, S 6 (*) <5 mIU/mL   Comment:              GEST. AGE      CONC.  (mIU/mL)       <=1 WEEK        5 - 50         2 WEEKS       50 - 500         3 WEEKS       100 - 10,000         4 WEEKS     1,000 -  30,000         5 WEEKS     3,500 - 115,000       6-8 WEEKS     12,000 - 270,000        12 WEEKS     15,000 - 220,000                FEMALE AND NON-PREGNANT FEMALE:         LESS THAN 5 mIU/mL     Assessment and Plan   1. Complete abortion   Pt has OCPs at home which she is restarting. Recommended back up method for 7 days, may follow up in our GYN clinic if desired. Precautions rev'd.     Medication List    TAKE these medications       ibuprofen 200 MG tablet  Commonly known as:  ADVIL,MOTRIN  Take 600 mg by mouth every 6 (six) hours as needed for pain.            Follow-up Information   Follow up with [facility] In 4 weeks. (someone will call to schedule appointment)    Contact information:   [address] [place] [address] [phone]        [facility] 05/13/2012, 5:33 PM

## 2012-05-13 NOTE — MAU Note (Addendum)
Took abortion pill about 4 wks ago, bled for about a wk, then every thing stopped. Started bleeding beginning of last wk- started bleeding heavy, passed a lot of clots and "something I didn't know what it was" so that is when I went to the hospital.  Was at [facility] on 04/25- had pos preg test and blood test, was told to follow up here.

## 2012-05-15 NOTE — MAU Provider Note (Signed)
Attestation of Attending Supervision of Advanced Practitioner (CNM/NP): Evaluation and management procedures were performed by the Advanced Practitioner under my supervision and collaboration.  I have reviewed the Advanced Practitioner's note and chart, and I agree with the management and plan.  Carla Whilden 05/15/2012 3:56 PM

## 2012-06-22 ENCOUNTER — Emergency Department (INDEPENDENT_AMBULATORY_CARE_PROVIDER_SITE_OTHER)
Admission: EM | Admit: 2012-06-22 | Discharge: 2012-06-22 | Disposition: A | Discharge status: Home / Self Care | Payer: BC Managed Care – PPO | Source: Home / Self Care | Attending: Emergency Medicine | Admitting: Emergency Medicine

## 2012-06-22 ENCOUNTER — Encounter (HOSPITAL_COMMUNITY): Payer: Self-pay | Admitting: Emergency Medicine

## 2012-06-22 DIAGNOSIS — N921 Excessive and frequent menstruation with irregular cycle: Secondary | ICD-10-CM

## 2012-06-22 LAB — POCT URINALYSIS DIP (DEVICE)
Ketones, ur: NEGATIVE mg/dL
Protein, ur: NEGATIVE mg/dL
Urobilinogen, UA: 0.2 mg/dL (ref 0.0–1.0)
pH: 7 (ref 5.0–8.0)

## 2012-06-22 LAB — POCT PREGNANCY, URINE: Preg Test, Ur: NEGATIVE

## 2012-06-22 NOTE — ED Provider Notes (Signed)
History     CSN: [id number]  Arrival date & time 06/22/12  1152   First MD Initiated Contact with Patient 06/22/12 1328      Chief Complaint  Patient presents with  . Metrorrhagia    pt c/o btwn cycle. pain with intercourse with bright red spotting x last night.     (Consider location/radiation/quality/duration/timing/severity/associated sxs/prior treatment) HPI Comments: Pt presents c/o irregular vaginal bleeding for 2 weeks, starting 1 week after she discontinued her birth control.  This started as dark brown blood and since then she has had some bright red spotting.  She also admits to some mild pain with intercourse.  Denies fever, chills, discharge, NVD, abdominal pain.     Past Medical History  Diagnosis Date  . Asthma     childhood  . Trichomonas   . Chlamydia     History reviewed. No pertinent past surgical history.  History reviewed. No pertinent family history.  History  Substance Use Topics  . Smoking status: Never Smoker   . Smokeless tobacco: Never Used  . Alcohol Use: 5.4 oz/week    5 Glasses of wine, 4 Shots of liquor per week     Comment: Every other day and on most weekends    OB History   Grav Para Term Preterm Abortions TAB SAB Ect Mult Living   1               Review of Systems  Constitutional: Negative for fever and chills.  Eyes: Negative for visual disturbance.  Respiratory: Negative for cough and shortness of breath.   Cardiovascular: Negative for chest pain, palpitations and leg swelling.  Gastrointestinal: Negative for nausea, vomiting and abdominal pain.  Endocrine: Negative for polydipsia and polyuria.  Genitourinary: Positive for vaginal bleeding, menstrual problem and dyspareunia. Negative for dysuria, urgency, frequency, vaginal discharge, genital sores, vaginal pain and pelvic pain.  Musculoskeletal: Negative for myalgias and arthralgias.  Skin: Negative for rash.  Neurological: Negative for dizziness, weakness and  light-headedness.    Allergies  Review of patient's allergies indicates no known allergies.  Home Medications   Current Outpatient Rx  Name  Route  Sig  Dispense  Refill  . ibuprofen (ADVIL,MOTRIN) 200 MG tablet   Oral   Take 600 mg by mouth every 6 (six) hours as needed for pain.            BP 123/73  Pulse 51  Temp(Src) 99 F (37.2 C) (Oral)  Resp 15  SpO2 100%  LMP 06/15/2012  Physical Exam  Nursing note and vitals reviewed. Constitutional: She is oriented to person, place, and time. Vital signs are normal. She appears well-developed and well-nourished. No distress.  HENT:  Head: Atraumatic.  Eyes: EOM are normal. Pupils are equal, round, and reactive to light.  Cardiovascular: Normal rate, regular rhythm and normal heart sounds.  Exam reveals no gallop and no friction rub.   No murmur heard. Pulmonary/Chest: Effort normal and breath sounds normal. No respiratory distress. She has no wheezes. She has no rales.  Abdominal: Soft. There is no tenderness.  Genitourinary: There is a foreign body (hair deep within vaginal vault near the cervix, removed with alligator forceps ) around the vagina.  Neurological: She is alert and oriented to person, place, and time. She has normal strength.  Skin: Skin is warm and dry. She is not diaphoretic.  Psychiatric: She has a normal mood and affect. Her behavior is normal. Judgment normal.    ED Course  Procedures (including  critical care time)  Labs Reviewed  POCT URINALYSIS DIP (DEVICE) - Abnormal; Notable for the following:    Hgb urine dipstick MODERATE (*)    Leukocytes, UA TRACE (*)    All other components within normal limits  POCT PREGNANCY, URINE   No results found.   1. Metrorrhagia       MDM  Hair removed, but suspect this is not the cause of the bleeding although it could explain some of the discomfort during intercourse.  Will give her info for on call GYN and have her call early in the week if this problem  does not resolved with removal of foreign body.          Graylon Good, PA-C 06/22/12 1413

## 2012-06-22 NOTE — ED Notes (Signed)
Pt c/o spotting before, after, cycle.  X 2wks with this last cycle on may 31st.  Pain with intercourse with bright red bleeding x last night.  Pt states that she recently stopped taking BC pills x 3 wks ago. Pt denies any concerns for std's and any other symptoms.

## 2012-06-22 NOTE — ED Provider Notes (Signed)
Medical screening examination/treatment/procedure(s) were performed by non-physician practitioner and as supervising physician I was immediately available for consultation/collaboration.  Leslee Home, M.D.  Reuben Likes, MD 06/22/12 Jerene Bears

## 2013-04-03 ENCOUNTER — Emergency Department (HOSPITAL_COMMUNITY)
Admission: EM | Admit: 2013-04-03 | Discharge: 2013-04-03 | Disposition: A | Discharge status: Home / Self Care | Payer: BC Managed Care – PPO | Attending: Emergency Medicine | Admitting: Emergency Medicine

## 2013-04-03 ENCOUNTER — Encounter (HOSPITAL_COMMUNITY): Payer: Self-pay | Admitting: Emergency Medicine

## 2013-04-03 DIAGNOSIS — K529 Noninfective gastroenteritis and colitis, unspecified: Secondary | ICD-10-CM

## 2013-04-03 DIAGNOSIS — Z8619 Personal history of other infectious and parasitic diseases: Secondary | ICD-10-CM | POA: Insufficient documentation

## 2013-04-03 DIAGNOSIS — J45909 Unspecified asthma, uncomplicated: Secondary | ICD-10-CM | POA: Insufficient documentation

## 2013-04-03 DIAGNOSIS — Z3202 Encounter for pregnancy test, result negative: Secondary | ICD-10-CM | POA: Insufficient documentation

## 2013-04-03 DIAGNOSIS — K5289 Other specified noninfective gastroenteritis and colitis: Secondary | ICD-10-CM | POA: Insufficient documentation

## 2013-04-03 LAB — CBC WITH DIFFERENTIAL/PLATELET
Basophils Absolute: 0 10*3/uL (ref 0.0–0.1)
Basophils Relative: 0 % (ref 0–1)
EOS PCT: 0 % (ref 0–5)
Eosinophils Absolute: 0 10*3/uL (ref 0.0–0.7)
HCT: 40 % (ref 36.0–46.0)
Hemoglobin: 13.3 g/dL (ref 12.0–15.0)
LYMPHS ABS: 0.5 10*3/uL — AB (ref 0.7–4.0)
LYMPHS PCT: 5 % — AB (ref 12–46)
MCH: 26.6 pg (ref 26.0–34.0)
MCHC: 33.3 g/dL (ref 30.0–36.0)
MCV: 80 fL (ref 78.0–100.0)
Monocytes Absolute: 0.5 10*3/uL (ref 0.1–1.0)
Monocytes Relative: 5 % (ref 3–12)
Neutro Abs: 9.4 10*3/uL — ABNORMAL HIGH (ref 1.7–7.7)
Neutrophils Relative %: 90 % — ABNORMAL HIGH (ref 43–77)
PLATELETS: 389 10*3/uL (ref 150–400)
RBC: 5 MIL/uL (ref 3.87–5.11)
RDW: 14.3 % (ref 11.5–15.5)
WBC: 10.4 10*3/uL (ref 4.0–10.5)

## 2013-04-03 LAB — URINALYSIS, ROUTINE W REFLEX MICROSCOPIC
GLUCOSE, UA: NEGATIVE mg/dL
Leukocytes, UA: NEGATIVE
Nitrite: NEGATIVE
PROTEIN: 30 mg/dL — AB
Specific Gravity, Urine: 1.031 — ABNORMAL HIGH (ref 1.005–1.030)
Urobilinogen, UA: 0.2 mg/dL (ref 0.0–1.0)
pH: 5.5 (ref 5.0–8.0)

## 2013-04-03 LAB — POC URINE PREG, ED: Preg Test, Ur: NEGATIVE

## 2013-04-03 LAB — COMPREHENSIVE METABOLIC PANEL
ALK PHOS: 62 U/L (ref 39–117)
ALT: 15 U/L (ref 0–35)
AST: 24 U/L (ref 0–37)
Albumin: 4.5 g/dL (ref 3.5–5.2)
BILIRUBIN TOTAL: 0.9 mg/dL (ref 0.3–1.2)
BUN: 15 mg/dL (ref 6–23)
CHLORIDE: 95 meq/L — AB (ref 96–112)
CO2: 19 meq/L (ref 19–32)
Calcium: 9.4 mg/dL (ref 8.4–10.5)
Creatinine, Ser: 0.6 mg/dL (ref 0.50–1.10)
GLUCOSE: 116 mg/dL — AB (ref 70–99)
POTASSIUM: 3.9 meq/L (ref 3.7–5.3)
SODIUM: 134 meq/L — AB (ref 137–147)
Total Protein: 8.3 g/dL (ref 6.0–8.3)

## 2013-04-03 LAB — URINE MICROSCOPIC-ADD ON

## 2013-04-03 LAB — LIPASE, BLOOD: Lipase: 14 U/L (ref 11–59)

## 2013-04-03 MED ORDER — ACETAMINOPHEN 500 MG PO TABS
1000.0000 mg | ORAL_TABLET | Freq: Once | ORAL | Status: DC
  Filled 2013-04-03: qty 2

## 2013-04-03 MED ORDER — ONDANSETRON HCL 4 MG/2ML IJ SOLN
4.0000 mg | Freq: Once | INTRAMUSCULAR | Status: AC
  Administered 2013-04-03: 4 mg via INTRAVENOUS
  Filled 2013-04-03: qty 2

## 2013-04-03 MED ORDER — ONDANSETRON 4 MG PO TBDP: 4.0000 mg | ORAL_TABLET | Freq: Once | ORAL | Status: DC

## 2013-04-03 MED ORDER — KETOROLAC TROMETHAMINE 30 MG/ML IJ SOLN
30.0000 mg | Freq: Once | INTRAMUSCULAR | Status: AC
  Administered 2013-04-03: 30 mg via INTRAVENOUS
  Filled 2013-04-03: qty 1

## 2013-04-03 MED ORDER — SODIUM CHLORIDE 0.9 % IV BOLUS (SEPSIS)
1000.0000 mL | Freq: Once | INTRAVENOUS | Status: AC
  Administered 2013-04-03: 1000 mL via INTRAVENOUS

## 2013-04-03 MED ORDER — ONDANSETRON 4 MG PO TBDP: ORAL_TABLET | ORAL | Status: DC

## 2013-04-03 NOTE — ED Notes (Signed)
Patient unable to provide urine at this time

## 2013-04-03 NOTE — ED Notes (Signed)
MD at bedside. 

## 2013-04-03 NOTE — ED Provider Notes (Signed)
CSN: [id number]     Arrival date & time 04/03/13  [id number] History   First MD Initiated Contact with Patient 04/03/13 [phone]     Chief Complaint  Patient presents with  . Emesis  . Abdominal Pain     (Consider location/radiation/quality/duration/timing/severity/associated sxs/prior Treatment) HPI Comments: 23 year old female presents with nausea, vomiting, diarrhea, and intermittent crampy abdominal pain since yesterday morning about 24 hours ago. She states she thinks she has "a stomach bug". She's not been able to control her vomiting and not been able to keep down any fluids. She's not had any fevers or chills. She's not had any recent travel or recent antibiotic use. The have any urinary or vaginal symptoms. The pain improves when she vomits. Currently the pain is milder and she rates as a current 6/10. The pain is mostly located in her epigastrium. She has not taken anything for the pain. Denies blood in her stool or vomit. Her diarrhea is "straight water".   Past Medical History  Diagnosis Date  . Asthma     childhood  . Trichomonas   . Chlamydia    History reviewed. No pertinent past surgical history. History reviewed. No pertinent family history. History  Substance Use Topics  . Smoking status: Never Smoker   . Smokeless tobacco: Never Used  . Alcohol Use: 0.0 oz/week     Comment: weekends   OB History   Grav Para Term Preterm Abortions TAB SAB Ect Mult Living   1              Review of Systems  Constitutional: Negative for fever.  Gastrointestinal: Positive for nausea, vomiting, abdominal pain and diarrhea. Negative for blood in stool.  Genitourinary: Negative for dysuria, vaginal bleeding and menstrual problem.      Allergies  Review of patient's allergies indicates no known allergies.  Home Medications   Current Outpatient Rx  Name  Route  Sig  Dispense  Refill  . ibuprofen (ADVIL,MOTRIN) 200 MG tablet   Oral   Take 600 mg by mouth every 6 (six) hours as  needed for pain.           BP 132/87  Pulse 94  Temp(Src) 98.2 F (36.8 C) (Oral)  Resp 18  SpO2 100%  LMP 03/22/2013 Physical Exam  Nursing note and vitals reviewed. Constitutional: She is oriented to person, place, and time. She appears well-developed and well-nourished.  HENT:  Head: Normocephalic and atraumatic.  Right Ear: External ear normal.  Left Ear: External ear normal.  Nose: Nose normal.  Mouth/Throat: Oropharynx is clear and moist.  Eyes: Right eye exhibits no discharge. Left eye exhibits no discharge.  Cardiovascular: Normal rate, regular rhythm and normal heart sounds.   Pulmonary/Chest: Effort normal and breath sounds normal.  Abdominal: Soft. Normal appearance. She exhibits no distension. There is tenderness (mild) in the periumbilical area.  No RLQ tenderness  Neurological: She is alert and oriented to person, place, and time.  Skin: Skin is warm and dry.    ED Course  Procedures (including critical care time) Labs Review Labs Reviewed  CBC WITH DIFFERENTIAL - Abnormal; Notable for the following:    Neutrophils Relative % 90 (*)    Neutro Abs 9.4 (*)    Lymphocytes Relative 5 (*)    Lymphs Abs 0.5 (*)    All other components within normal limits  COMPREHENSIVE METABOLIC PANEL - Abnormal; Notable for the following:    Sodium 134 (*)    Chloride 95 (*)  Glucose, Bld 116 (*)    All other components within normal limits  URINALYSIS, ROUTINE W REFLEX MICROSCOPIC - Abnormal; Notable for the following:    Color, Urine AMBER (*)    APPearance CLOUDY (*)    Specific Gravity, Urine 1.031 (*)    Hgb urine dipstick MODERATE (*)    Bilirubin Urine SMALL (*)    Ketones, ur >80 (*)    Protein, ur 30 (*)    All other components within normal limits  URINE MICROSCOPIC-ADD ON - Abnormal; Notable for the following:    Squamous Epithelial / LPF FEW (*)    Bacteria, UA FEW (*)    All other components within normal limits  LIPASE, BLOOD  POC URINE PREG, ED    Imaging Review No results found.   EKG Interpretation None      MDM   Final diagnoses:  Gastroenteritis    Patient with mild, nonspecific tenderness. Her history and exam are consistent with acute gastroenteritis. No red flags. No urinary or vaginal sx. Not concerned with acute surgical pathology such as appendicitis or SBO. Given IV hydration, nausea and pain control. Labs benign except ketones in urine. Patient is coming off of her period, which likely explains microscopic hematuria. She was able to take PO in ED w/o difficulty, will d/c with oral hydration, zofran and return precautions.     Audree Camel, MD 04/03/13 [phone]

## 2013-04-03 NOTE — ED Notes (Signed)
Pt states she has had abd pain with nausea and vomiting since yesterday morning about 1000  Pt has uncontrollable vomiting in triage

## 2013-04-03 NOTE — ED Notes (Signed)
While attempting to place PIV site/obtain labs, patient repeatedly moving arm and then stated "I need a minute." Dr. Criss AlvineGoldston now at bedside Will attempt PIV placement when patient is more calm and willing to allow staff to place PIV site

## 2013-04-10 ENCOUNTER — Ambulatory Visit (INDEPENDENT_AMBULATORY_CARE_PROVIDER_SITE_OTHER): Payer: BC Managed Care – PPO | Admitting: Obstetrics

## 2013-04-10 ENCOUNTER — Encounter: Payer: Self-pay | Admitting: Obstetrics

## 2013-04-10 VITALS — BP 122/71 | HR 64 | Temp 98.1°F | Ht 66.0 in | Wt 154.0 lb

## 2013-04-10 DIAGNOSIS — Z01419 Encounter for gynecological examination (general) (routine) without abnormal findings: Secondary | ICD-10-CM

## 2013-04-10 DIAGNOSIS — Z0000 Encounter for general adult medical examination without abnormal findings: Secondary | ICD-10-CM

## 2013-04-10 DIAGNOSIS — Z3009 Encounter for other general counseling and advice on contraception: Secondary | ICD-10-CM

## 2013-04-10 LAB — POCT URINALYSIS DIPSTICK
Bilirubin, UA: NEGATIVE
GLUCOSE UA: NEGATIVE
Ketones, UA: NEGATIVE
Leukocytes, UA: NEGATIVE
NITRITE UA: NEGATIVE
RBC UA: NEGATIVE
Spec Grav, UA: <=1.005
UROBILINOGEN UA: NEGATIVE
pH, UA: 8

## 2013-04-10 LAB — OB RESULTS CONSOLE GC/CHLAMYDIA
Chlamydia: NEGATIVE
Gonorrhea: NEGATIVE

## 2013-04-10 LAB — POCT URINE PREGNANCY: PREG TEST UR: NEGATIVE

## 2013-04-10 MED ORDER — NORGESTIM-ETH ESTRAD TRIPHASIC 0.18/0.215/0.25 MG-35 MCG PO TABS: 1.0000 | ORAL_TABLET | Freq: Every day | ORAL | Status: DC

## 2013-04-10 NOTE — Progress Notes (Signed)
Subjective:     Autumn Crawford is a 23 y.o. female here for a routine exam.  Current complaints: pt would like to make sure urine is ok.  Pt was previously seen at ED and was told she had blood in her urine.  Pt denies any pain, burning or irritation.  Pt request STD testing and UPT.  Personal health questionnaire reviewed: yes.   Gynecologic History Patient's last menstrual period was 03/22/2013. Contraception: none Last Pap: none. Last mammogram:n/a   Obstetric History OB History  Gravida Para Term Preterm AB SAB TAB Ectopic Multiple Living  2    2 1 1        # Outcome Date GA Lbr Len/2nd Weight Sex Delivery Anes PTL Lv  2 TAB 2014 12618w0d         1 SAB 2013 71618w0d            Comments: System Generated. Please review and update pregnancy details.       The following portions of the patient's history were reviewed and updated as appropriate: allergies, current medications, past family history, past medical history, past social history, past surgical history and problem list.  Review of Systems Pertinent items are noted in HPI.    Objective:    General appearance: alert and no distress Breasts: normal appearance, no masses or tenderness Abdomen: normal findings: soft, non-tender Pelvic: cervix normal in appearance, external genitalia normal, no adnexal masses or tenderness, no cervical motion tenderness, rectovaginal septum normal, uterus normal size, shape, and consistency and vagina normal without discharge    Assessment:    Healthy female exam.   Counseling for contraception   Plan:    Education reviewed: safe sex/STD prevention and contraceptive options. Contraception: OCP (estrogen/progesterone). Follow up in: 1 year. Tri Sprintec 28 Rx.

## 2013-04-10 NOTE — Addendum Note (Signed)
Addended by: Marya LandryFOSTER, Frankie Scipio D on: 04/10/2013 06:05 PM   Modules accepted: Orders

## 2013-04-11 LAB — PAP IG W/ RFLX HPV ASCU

## 2013-04-11 LAB — GC/CHLAMYDIA PROBE AMP
CT PROBE, AMP APTIMA: NEGATIVE
GC PROBE AMP APTIMA: NEGATIVE

## 2013-04-11 LAB — WET PREP BY MOLECULAR PROBE
Candida species: NEGATIVE
Gardnerella vaginalis: NEGATIVE
TRICHOMONAS VAG: NEGATIVE

## 2013-05-08 IMAGING — US US OB COMP LESS 14 WK
1 series · 13 of 28 positions shown · non-contrast
Comparison: None.

CLINICAL DATA: Pregnant patient with pain.

OBSTETRIC <14 WK US AND TRANSVAGINAL OB US
TECHNIQUE: Both transabdominal and transvaginal ultrasound
examinations were performed for complete evaluation of the
gestation as well as the maternal uterus, adnexal regions, and
pelvic cul-de-sac.  Transvaginal technique was performed to assess
early pregnancy.

[Series 1: us ob comp less 14 wk · 0.20mm/px · 13 of 79 slices shown]
[im 3/79]
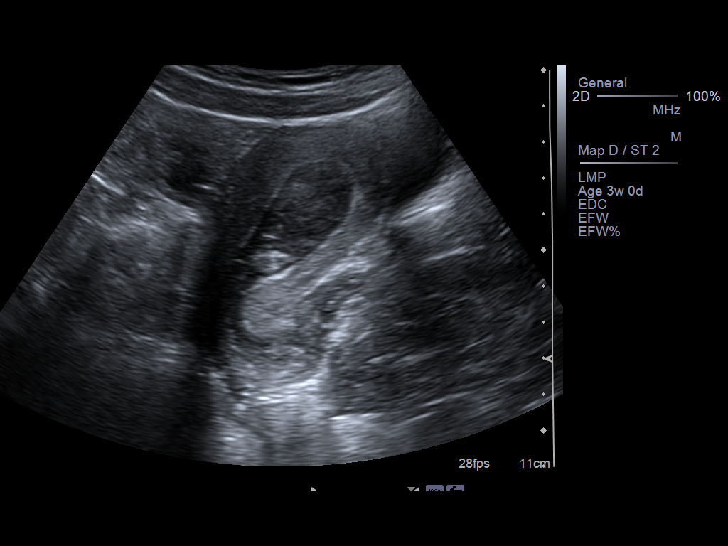
[im 9/79]
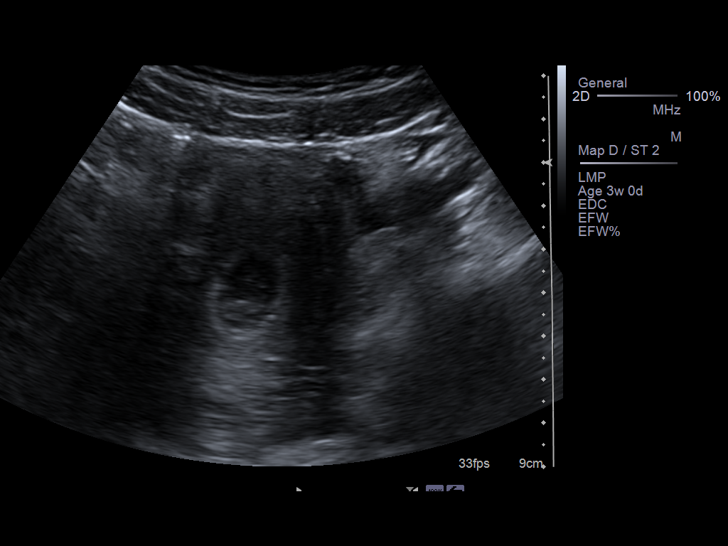
[im 15/79]
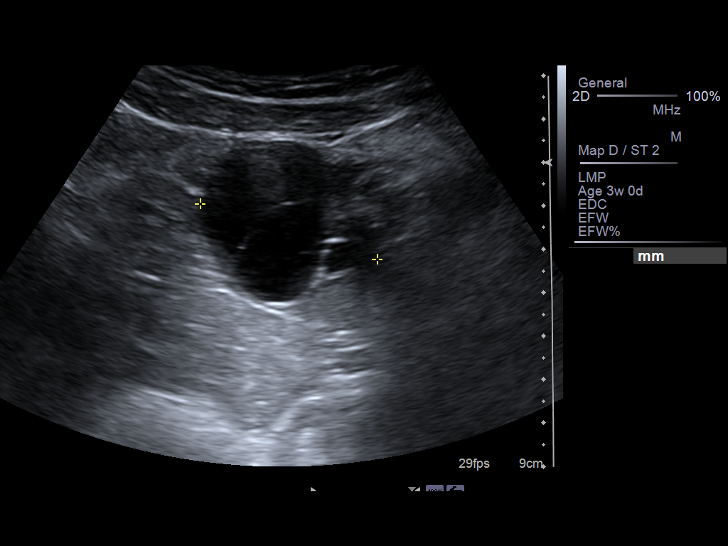
[im 21/79]
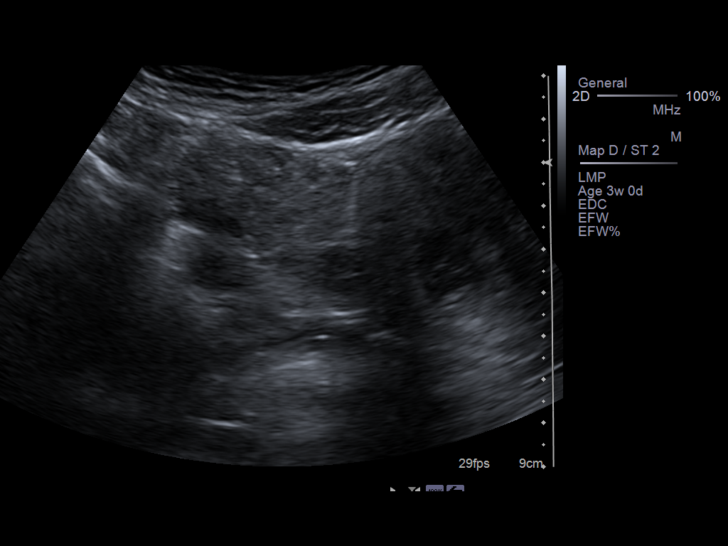
[im 27/79]
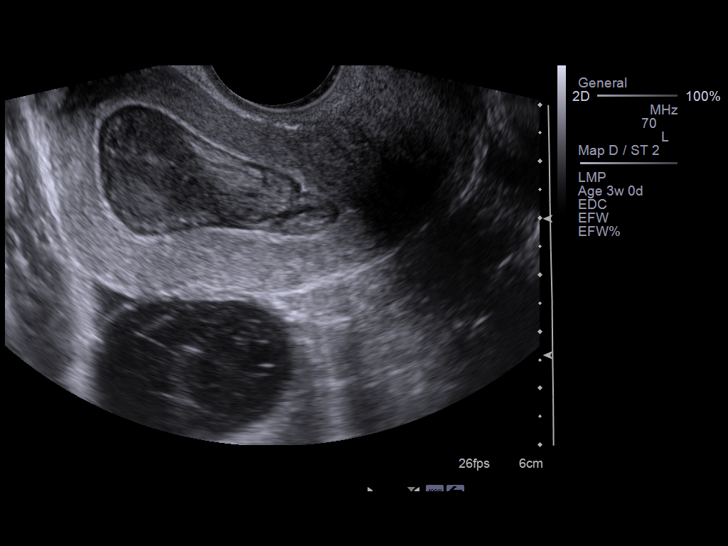
[im 32/79]
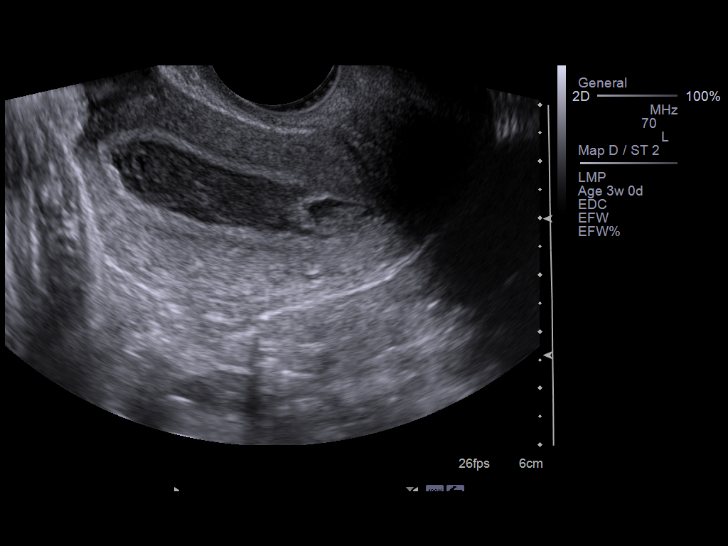
[im 41/79]
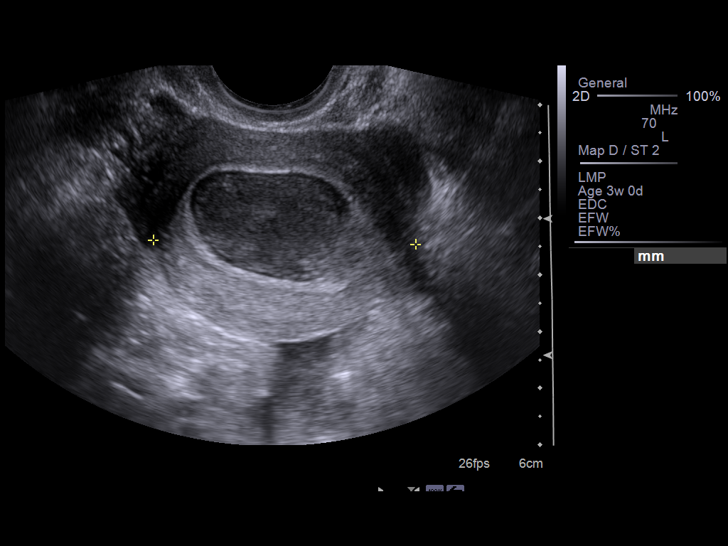
[im 47/79]
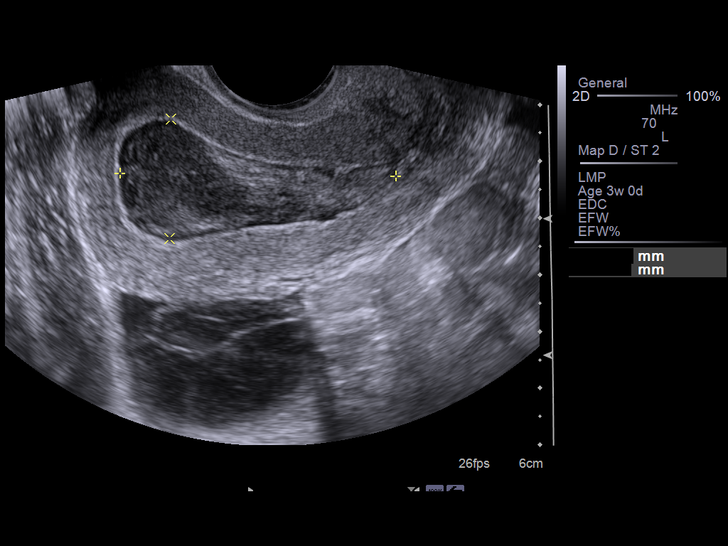
[im 53/79]
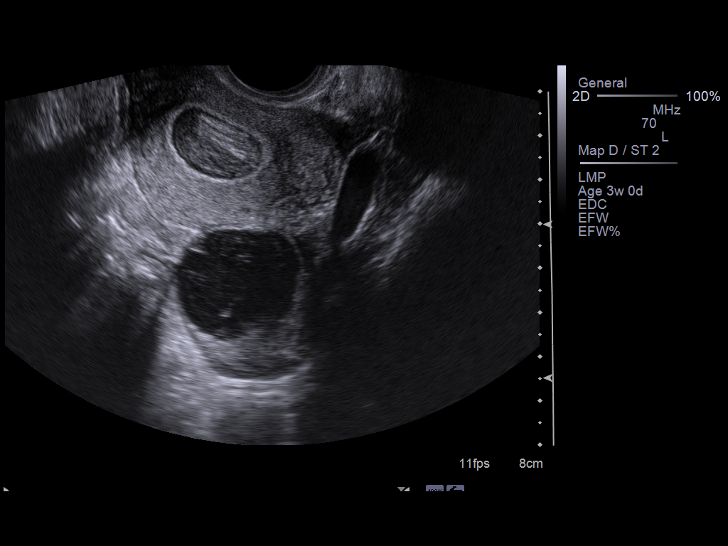
[im 58/79]
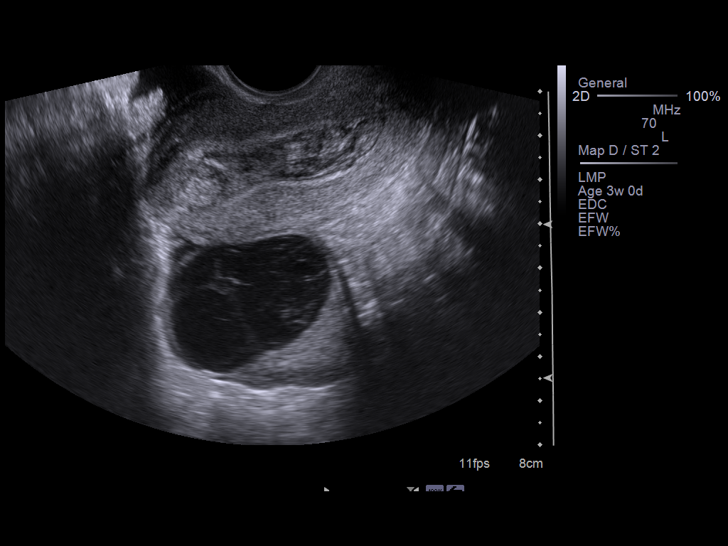
[im 64/79]
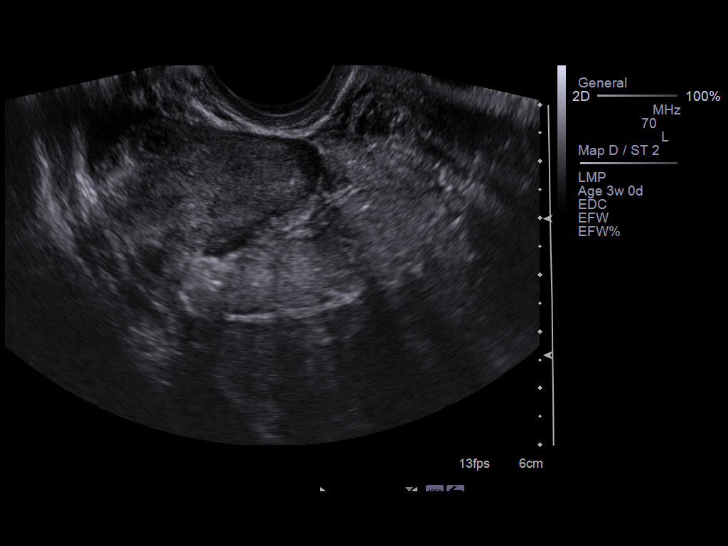
[im 70/79]
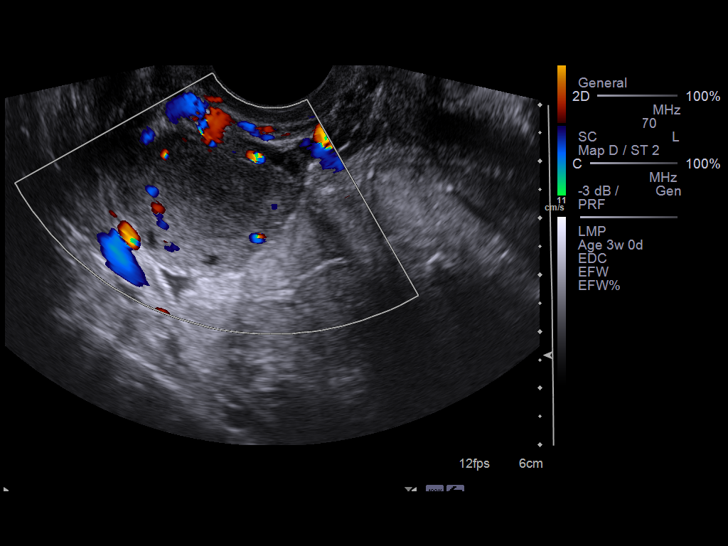
[im 76/79]
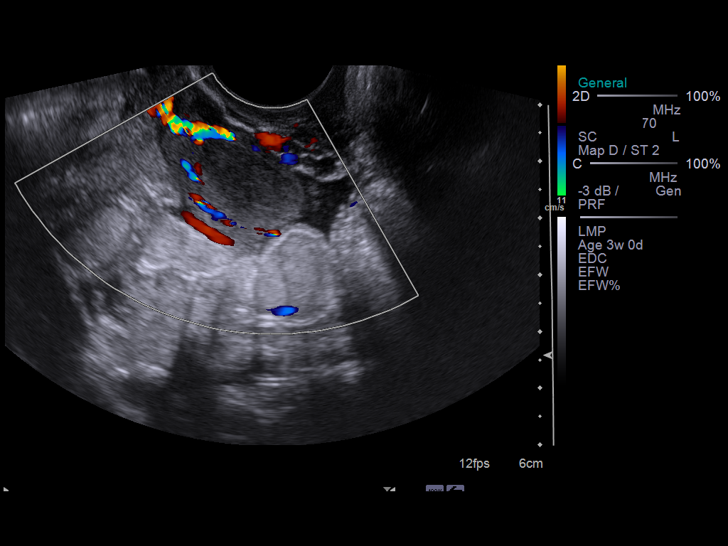

[13 of 28 positions shown; findings below may reference images not displayed]

Intrauterine gestational sac:  There is a large volume of echogenic
material within the endometrial canal consistent with hemorrhage.
In the lower uterine segment, there appears to be a more focal area
of increased attenuation which may represent a gestational sac.  No
evidence of viable intrauterine pregnancy is identified.

Maternal uterus/adnexae:
The right ovary measures 4.1 by 3.8 x 2.1 cm.  A focal collection
within the right ovary measures 2.4 x 1.4 x 2.1 cm.

The left ovary measures 4.8 x 3.6 x 3.7 cm.  There is a focal
lesion within the left ovary measuring 2.7 x 3.9 by 3.2 cm with
multiple Sara Michel internal echoes. There is a small volume of free
pelvic fluid.
IMPRESSION: 1.  Findings most consistent with abortion in progress with a large
volume of clot seen in the uterus. Early intrauterine pregnancy is
identified.
2.  Cystic lesion in the left ovary has an appearance consistent
with a hemorrhagic cyst.
3.  Nonspecific lesion in the right ovary may also be a hemorrhagic
cyst.  Repeat ultrasound in 6-8 weeks is recommended for further
evaluation.

## 2013-11-17 ENCOUNTER — Encounter: Payer: Self-pay | Admitting: Obstetrics

## 2013-11-26 ENCOUNTER — Ambulatory Visit (INDEPENDENT_AMBULATORY_CARE_PROVIDER_SITE_OTHER): Payer: BC Managed Care – PPO | Admitting: Advanced Practice Midwife

## 2013-11-26 ENCOUNTER — Encounter: Payer: Self-pay | Admitting: Advanced Practice Midwife

## 2013-11-26 VITALS — BP 125/70 | HR 65 | Wt 153.0 lb

## 2013-11-26 DIAGNOSIS — Z3481 Encounter for supervision of other normal pregnancy, first trimester: Secondary | ICD-10-CM | POA: Diagnosis not present

## 2013-11-26 DIAGNOSIS — Z3490 Encounter for supervision of normal pregnancy, unspecified, unspecified trimester: Secondary | ICD-10-CM

## 2013-11-26 MED ORDER — DOXYLAMINE-PYRIDOXINE 10-10 MG PO TBEC: DELAYED_RELEASE_TABLET | ORAL | Status: DC

## 2013-11-26 NOTE — Progress Notes (Signed)
   Subjective:    Autumn Crawford is a [id number] 9220w1d being seen today for her first obstetrical visit.  Her obstetrical history is significant for SAB x 1, TAB x 1. Patient does intend to breast feed. Pregnancy history fully reviewed.  Patient reports nausea.  Filed Vitals:   11/26/13 1414  BP: 125/70  Pulse: 65  Weight: 153 lb (69.4 kg)    HISTORY: OB History  Gravida Para Term Preterm AB SAB TAB Ectopic Multiple Living  3    2 1 1        # Outcome Date GA Lbr Len/2nd Weight Sex Delivery Anes PTL Lv  3 Current           2 TAB 2014 2229w0d         1 SAB 2013 6929w0d            Comments: System Generated. Please review and update pregnancy details.     Past Medical History  Diagnosis Date  . Asthma     childhood  . Trichomonas   . Chlamydia    History reviewed. No pertinent past surgical history. Family History  Problem Relation Age of Onset  . Fibroids Mother   . Hypertension Maternal Grandmother   . Diabetes Maternal Grandmother   . COPD Maternal Grandmother     bone     Exam    Uterus:   8620w1d by bedside u/s in office  Pelvic Exam: Deferred  System: Breast:  normal appearance, no masses or tenderness   Skin: normal coloration and turgor, no rashes    Neurologic: oriented, normal, gait normal; reflexes normal and symmetric   Extremities: normal strength, tone, and muscle mass, ROM of all joints is normal   HEENT neck supple with midline trachea and thyroid without masses   Mouth/Teeth mucous membranes moist, pharynx normal without lesions and dental hygiene good   Neck supple and no masses   Cardiovascular:    Respiratory:  appears well, vitals normal, no respiratory distress, acyanotic, normal RR, ear and throat exam is normal, neck free of mass or lymphadenopathy   Abdomen: soft, non-tender; bowel sounds normal; no masses,  no organomegaly   Urinary:       Assessment:    Pregnancy: [id number] Patient Active Problem List   Diagnosis Date Noted  . Supervision  of normal pregnancy 11/26/2013        Plan:     Initial labs drawn. Prenatal vitamins. Diclegis Rx Problem list reviewed and updated. Genetic Screening discussed First Screen: ordered.  Ultrasound discussed; fetal survey: requested.  Follow up in 4 weeks. 50% of 30 min visit spent on counseling and coordination of care.     LEFTWICH-KIRBY, Kimberlly Norgard 11/26/2013

## 2013-11-27 LAB — PRENATAL PROFILE (SOLSTAS)
Antibody Screen: NEGATIVE
BASOS ABS: 0 10*3/uL (ref 0.0–0.1)
BASOS PCT: 0 % (ref 0–1)
Eosinophils Absolute: 0.1 10*3/uL (ref 0.0–0.7)
Eosinophils Relative: 1 % (ref 0–5)
HEMATOCRIT: 37.2 % (ref 36.0–46.0)
HEP B S AG: NEGATIVE
HIV: NONREACTIVE
Hemoglobin: 12.1 g/dL (ref 12.0–15.0)
LYMPHS PCT: 27 % (ref 12–46)
Lymphs Abs: 2.5 10*3/uL (ref 0.7–4.0)
MCH: 27.4 pg (ref 26.0–34.0)
MCHC: 32.5 g/dL (ref 30.0–36.0)
MCV: 84.2 fL (ref 78.0–100.0)
MONO ABS: 0.4 10*3/uL (ref 0.1–1.0)
Monocytes Relative: 4 % (ref 3–12)
NEUTROS ABS: 6.3 10*3/uL (ref 1.7–7.7)
NEUTROS PCT: 68 % (ref 43–77)
Platelets: 366 10*3/uL (ref 150–400)
RBC: 4.42 MIL/uL (ref 3.87–5.11)
RDW: 14.9 % (ref 11.5–15.5)
RH TYPE: POSITIVE
Rubella: 1.89 Index — ABNORMAL HIGH (ref <0.90)
WBC: 9.3 10*3/uL (ref 4.0–10.5)

## 2013-11-27 LAB — GC/CHLAMYDIA PROBE AMP, URINE
CHLAMYDIA, SWAB/URINE, PCR: NEGATIVE
GC PROBE AMP, URINE: NEGATIVE

## 2013-11-28 LAB — CULTURE, OB URINE
Colony Count: NO GROWTH
ORGANISM ID, BACTERIA: NO GROWTH

## 2013-12-23 ENCOUNTER — Encounter: Payer: BC Managed Care – PPO | Admitting: Family Medicine

## 2013-12-30 ENCOUNTER — Encounter: Payer: Self-pay | Admitting: Family Medicine

## 2013-12-30 ENCOUNTER — Ambulatory Visit (INDEPENDENT_AMBULATORY_CARE_PROVIDER_SITE_OTHER): Payer: BC Managed Care – PPO | Admitting: Family Medicine

## 2013-12-30 VITALS — BP 113/82 | HR 71 | Wt 149.0 lb

## 2013-12-30 DIAGNOSIS — Z3400 Encounter for supervision of normal first pregnancy, unspecified trimester: Secondary | ICD-10-CM | POA: Insufficient documentation

## 2013-12-30 DIAGNOSIS — Z3402 Encounter for supervision of normal first pregnancy, second trimester: Secondary | ICD-10-CM

## 2013-12-30 DIAGNOSIS — Z23 Encounter for immunization: Secondary | ICD-10-CM | POA: Diagnosis not present

## 2013-12-30 DIAGNOSIS — Z3490 Encounter for supervision of normal pregnancy, unspecified, unspecified trimester: Secondary | ICD-10-CM

## 2013-12-30 DIAGNOSIS — Z3481 Encounter for supervision of other normal pregnancy, first trimester: Secondary | ICD-10-CM

## 2013-12-30 NOTE — Patient Instructions (Signed)
Second Trimester of Pregnancy The second trimester is from week 13 through week 28, months 4 through 6. The second trimester is often a time when you feel your best. Your body has also adjusted to being pregnant, and you begin to feel better physically. Usually, morning sickness has lessened or quit completely, you may have more energy, and you may have an increase in appetite. The second trimester is also a time when the fetus is growing rapidly. At the end of the sixth month, the fetus is about 9 inches long and weighs about 1 pounds. You will likely begin to feel the baby move (quickening) between 18 and 20 weeks of the pregnancy. BODY CHANGES Your body goes through many changes during pregnancy. The changes vary from woman to woman.   Your weight will continue to increase. You will notice your lower abdomen bulging out.  You may begin to get stretch marks on your hips, abdomen, and breasts.  You may develop headaches that can be relieved by medicines approved by your health care provider.  You may urinate more often because the fetus is pressing on your bladder.  You may develop or continue to have heartburn as a result of your pregnancy.  You may develop constipation because certain hormones are causing the muscles that push waste through your intestines to slow down.  You may develop hemorrhoids or swollen, bulging veins (varicose veins).  You may have back pain because of the weight gain and pregnancy hormones relaxing your joints between the bones in your pelvis and as a result of a shift in weight and the muscles that support your balance.  Your breasts will continue to grow and be tender.  Your gums may bleed and may be sensitive to brushing and flossing.  Dark spots or blotches (chloasma, mask of pregnancy) may develop on your face. This will likely fade after the baby is born.  A dark line from your belly button to the pubic area (linea nigra) may appear. This will likely  fade after the baby is born.  You may have changes in your hair. These can include thickening of your hair, rapid growth, and changes in texture. Some women also have hair loss during or after pregnancy, or hair that feels dry or thin. Your hair will most likely return to normal after your baby is born. WHAT TO EXPECT AT YOUR PRENATAL VISITS During a routine prenatal visit:  You will be weighed to make sure you and the fetus are growing normally.  Your blood pressure will be taken.  Your abdomen will be measured to track your baby's growth.  The fetal heartbeat will be listened to.  Any test results from the previous visit will be discussed. Your health care provider may ask you:  How you are feeling.  If you are feeling the baby move.  If you have had any abnormal symptoms, such as leaking fluid, bleeding, severe headaches, or abdominal cramping.  If you have any questions. Other tests that may be performed during your second trimester include:  Blood tests that check for:  Low iron levels (anemia).  Gestational diabetes (between 24 and 28 weeks).  Rh antibodies.  Urine tests to check for infections, diabetes, or protein in the urine.  An ultrasound to confirm the proper growth and development of the baby.  An amniocentesis to check for possible genetic problems.  Fetal screens for spina bifida and Down syndrome. HOME CARE INSTRUCTIONS   Avoid all smoking, herbs, alcohol, and unprescribed   drugs. These chemicals affect the formation and growth of the baby.  Follow your health care provider's instructions regarding medicine use. There are medicines that are either safe or unsafe to take during pregnancy.  Exercise only as directed by your health care provider. Experiencing uterine cramps is a good sign to stop exercising.  Continue to eat regular, healthy meals.  Wear a good support bra for breast tenderness.  Do not use hot tubs, steam rooms, or saunas.  Wear  your seat belt at all times when driving.  Avoid raw meat, uncooked cheese, cat litter boxes, and soil used by cats. These carry germs that can cause birth defects in the baby.  Take your prenatal vitamins.  Try taking a stool softener (if your health care provider approves) if you develop constipation. Eat more high-fiber foods, such as fresh vegetables or fruit and whole grains. Drink plenty of fluids to keep your urine clear or pale yellow.  Take warm sitz baths to soothe any pain or discomfort caused by hemorrhoids. Use hemorrhoid cream if your health care provider approves.  If you develop varicose veins, wear support hose. Elevate your feet for 15 minutes, 3-4 times a day. Limit salt in your diet.  Avoid heavy lifting, wear low heel shoes, and practice good posture.  Rest with your legs elevated if you have leg cramps or low back pain.  Visit your dentist if you have not gone yet during your pregnancy. Use a soft toothbrush to brush your teeth and be gentle when you floss.  A sexual relationship may be continued unless your health care provider directs you otherwise.  Continue to go to all your prenatal visits as directed by your health care provider. SEEK MEDICAL CARE IF:   You have dizziness.  You have mild pelvic cramps, pelvic pressure, or nagging pain in the abdominal area.  You have persistent nausea, vomiting, or diarrhea.  You have a bad smelling vaginal discharge.  You have pain with urination. SEEK IMMEDIATE MEDICAL CARE IF:   You have a fever.  You are leaking fluid from your vagina.  You have spotting or bleeding from your vagina.  You have severe abdominal cramping or pain.  You have rapid weight gain or loss.  You have shortness of breath with chest pain.  You notice sudden or extreme swelling of your face, hands, ankles, feet, or legs.  You have not felt your baby move in over an hour.  You have severe headaches that do not go away with  medicine.  You have vision changes. Document Released: 12/27/2000 Document Revised: 01/07/2013 Document Reviewed: 03/05/2012 [employer] Patient Information 2015 [employer]. This information is not intended to replace advice given to you by your health care provider. Make sure you discuss any questions you have with your health care provider.  Breastfeeding Deciding to breastfeed is one of the best choices you can make for you and your baby. A change in hormones during pregnancy causes your breast tissue to grow and increases the number and size of your milk ducts. These hormones also allow proteins, sugars, and fats from your blood supply to make breast milk in your milk-producing glands. Hormones prevent breast milk from being released before your baby is born as well as prompt milk flow after birth. Once breastfeeding has begun, thoughts of your baby, as well as his or her sucking or crying, can stimulate the release of milk from your milk-producing glands.  BENEFITS OF BREASTFEEDING For Your Baby  Your first   milk (colostrum) helps your baby's digestive system function better.   There are antibodies in your milk that help your baby fight off infections.   Your baby has a lower incidence of asthma, allergies, and sudden infant death syndrome.   The nutrients in breast milk are better for your baby than infant formulas and are designed uniquely for your baby's needs.   Breast milk improves your baby's brain development.   Your baby is less likely to develop other conditions, such as childhood obesity, asthma, or type 2 diabetes mellitus.  For You   Breastfeeding helps to create a very special bond between you and your baby.   Breastfeeding is convenient. Breast milk is always available at the correct temperature and costs nothing.   Breastfeeding helps to burn calories and helps you lose the weight gained during pregnancy.   Breastfeeding makes your uterus contract to its  prepregnancy size faster and slows bleeding (lochia) after you give birth.   Breastfeeding helps to lower your risk of developing type 2 diabetes mellitus, osteoporosis, and breast or ovarian cancer later in life. SIGNS THAT YOUR BABY IS HUNGRY Early Signs of Hunger  Increased alertness or activity.  Stretching.  Movement of the head from side to side.  Movement of the head and opening of the mouth when the corner of the mouth or cheek is stroked (rooting).  Increased sucking sounds, smacking lips, cooing, sighing, or squeaking.  Hand-to-mouth movements.  Increased sucking of fingers or hands. Late Signs of Hunger  Fussing.  Intermittent crying. Extreme Signs of Hunger Signs of extreme hunger will require calming and consoling before your baby will be able to breastfeed successfully. Do not wait for the following signs of extreme hunger to occur before you initiate breastfeeding:   Restlessness.  A loud, strong cry.   Screaming. BREASTFEEDING BASICS Breastfeeding Initiation  Find a comfortable place to sit or lie down, with your neck and back well supported.  Place a pillow or rolled up blanket under your baby to bring him or her to the level of your breast (if you are seated). Nursing pillows are specially designed to help support your arms and your baby while you breastfeed.  Make sure that your baby's abdomen is facing your abdomen.   Gently massage your breast. With your fingertips, massage from your chest wall toward your nipple in a circular motion. This encourages milk flow. You may need to continue this action during the feeding if your milk flows slowly.  Support your breast with 4 fingers underneath and your thumb above your nipple. Make sure your fingers are well away from your nipple and your baby's mouth.   Stroke your baby's lips gently with your finger or nipple.   When your baby's mouth is open wide enough, quickly bring your baby to your breast,  placing your entire nipple and as much of the colored area around your nipple (areola) as possible into your baby's mouth.   More areola should be visible above your baby's upper lip than below the lower lip.   Your baby's tongue should be between his or her lower gum and your breast.   Ensure that your baby's mouth is correctly positioned around your nipple (latched). Your baby's lips should create a seal on your breast and be turned out (everted).  It is common for your baby to suck about 2-3 minutes in order to start the flow of breast milk. Latching Teaching your baby how to latch on to your breast   properly is very important. An improper latch can cause nipple pain and decreased milk supply for you and poor weight gain in your baby. Also, if your baby is not latched onto your nipple properly, he or she may swallow some air during feeding. This can make your baby fussy. Burping your baby when you switch breasts during the feeding can help to get rid of the air. However, teaching your baby to latch on properly is still the best way to prevent fussiness from swallowing air while breastfeeding. Signs that your baby has successfully latched on to your nipple:    Silent tugging or silent sucking, without causing you pain.   Swallowing heard between every 3-4 sucks.    Muscle movement above and in front of his or her ears while sucking.  Signs that your baby has not successfully latched on to nipple:   Sucking sounds or smacking sounds from your baby while breastfeeding.  Nipple pain. If you think your baby has not latched on correctly, slip your finger into the corner of your baby's mouth to break the suction and place it between your baby's gums. Attempt breastfeeding initiation again. Signs of Successful Breastfeeding Signs from your baby:   A gradual decrease in the number of sucks or complete cessation of sucking.   Falling asleep.   Relaxation of his or her body.    Retention of a small amount of milk in his or her mouth.   Letting go of your breast by himself or herself. Signs from you:  Breasts that have increased in firmness, weight, and size 1-3 hours after feeding.   Breasts that are softer immediately after breastfeeding.  Increased milk volume, as well as a change in milk consistency and color by the fifth day of breastfeeding.   Nipples that are not sore, cracked, or bleeding. Signs That Your Baby is Getting Enough Milk  Wetting at least 3 diapers in a 24-hour period. The urine should be clear and pale yellow by age 5 days.  At least 3 stools in a 24-hour period by age 5 days. The stool should be soft and yellow.  At least 3 stools in a 24-hour period by age 7 days. The stool should be seedy and yellow.  No loss of weight greater than 10% of birth weight during the first 3 days of age.  Average weight gain of 4-7 ounces (113-198 g) per week after age 4 days.  Consistent daily weight gain by age 5 days, without weight loss after the age of 2 weeks. After a feeding, your baby may spit up a small amount. This is common. BREASTFEEDING FREQUENCY AND DURATION Frequent feeding will help you make more milk and can prevent sore nipples and breast engorgement. Breastfeed when you feel the need to reduce the fullness of your breasts or when your baby shows signs of hunger. This is called "breastfeeding on demand." Avoid introducing a pacifier to your baby while you are working to establish breastfeeding (the first 4-6 weeks after your baby is born). After this time you may choose to use a pacifier. Research has shown that pacifier use during the first year of a baby's life decreases the risk of sudden infant death syndrome (SIDS). Allow your baby to feed on each breast as long as he or she wants. Breastfeed until your baby is finished feeding. When your baby unlatches or falls asleep while feeding from the first breast, offer the second breast.  Because newborns are often sleepy in the   first few weeks of life, you may need to awaken your baby to get him or her to feed. Breastfeeding times will vary from baby to baby. However, the following rules can serve as a guide to help you ensure that your baby is properly fed:  Newborns (babies 4 weeks of age or younger) may breastfeed every 1-3 hours.  Newborns should not go longer than 3 hours during the day or 5 hours during the night without breastfeeding.  You should breastfeed your baby a minimum of 8 times in a 24-hour period until you begin to introduce solid foods to your baby at around 6 months of age. BREAST MILK PUMPING Pumping and storing breast milk allows you to ensure that your baby is exclusively fed your breast milk, even at times when you are unable to breastfeed. This is especially important if you are going back to work while you are still breastfeeding or when you are not able to be present during feedings. Your lactation consultant can give you guidelines on how long it is safe to store breast milk.  A breast pump is a machine that allows you to pump milk from your breast into a sterile bottle. The pumped breast milk can then be stored in a refrigerator or freezer. Some breast pumps are operated by hand, while others use electricity. Ask your lactation consultant which type will work best for you. Breast pumps can be purchased, but some hospitals and breastfeeding support groups lease breast pumps on a monthly basis. A lactation consultant can teach you how to hand express breast milk, if you prefer not to use a pump.  CARING FOR YOUR BREASTS WHILE YOU BREASTFEED Nipples can become dry, cracked, and sore while breastfeeding. The following recommendations can help keep your breasts moisturized and healthy:  Avoid using soap on your nipples.   Wear a supportive bra. Although not required, special nursing bras and tank tops are designed to allow access to your breasts for  breastfeeding without taking off your entire bra or top. Avoid wearing underwire-style bras or extremely tight bras.  Air dry your nipples for 3-4minutes after each feeding.   Use only cotton bra pads to absorb leaked breast milk. Leaking of breast milk between feedings is normal.   Use lanolin on your nipples after breastfeeding. Lanolin helps to maintain your skin's normal moisture barrier. If you use pure lanolin, you do not need to wash it off before feeding your baby again. Pure lanolin is not toxic to your baby. You may also hand express a few drops of breast milk and gently massage that milk into your nipples and allow the milk to air dry. In the first few weeks after giving birth, some women experience extremely full breasts (engorgement). Engorgement can make your breasts feel heavy, warm, and tender to the touch. Engorgement peaks within 3-5 days after you give birth. The following recommendations can help ease engorgement:  Completely empty your breasts while breastfeeding or pumping. You may want to start by applying warm, moist heat (in the shower or with warm water-soaked hand towels) just before feeding or pumping. This increases circulation and helps the milk flow. If your baby does not completely empty your breasts while breastfeeding, pump any extra milk after he or she is finished.  Wear a snug bra (nursing or regular) or tank top for 1-2 days to signal your body to slightly decrease milk production.  Apply ice packs to your breasts, unless this is too uncomfortable for you.    Make sure that your baby is latched on and positioned properly while breastfeeding. If engorgement persists after 48 hours of following these recommendations, contact your health care provider or a lactation consultant. OVERALL HEALTH CARE RECOMMENDATIONS WHILE BREASTFEEDING  Eat healthy foods. Alternate between meals and snacks, eating 3 of each per day. Because what you eat affects your breast milk,  some of the foods may make your baby more irritable than usual. Avoid eating these foods if you are sure that they are negatively affecting your baby.  Drink milk, fruit juice, and water to satisfy your thirst (about 10 glasses a day).   Rest often, relax, and continue to take your prenatal vitamins to prevent fatigue, stress, and anemia.  Continue breast self-awareness checks.  Avoid chewing and smoking tobacco.  Avoid alcohol and drug use. Some medicines that may be harmful to your baby can pass through breast milk. It is important to ask your health care provider before taking any medicine, including all over-the-counter and prescription medicine as well as vitamin and herbal supplements. It is possible to become pregnant while breastfeeding. If birth control is desired, ask your health care provider about options that will be safe for your baby. SEEK MEDICAL CARE IF:   You feel like you want to stop breastfeeding or have become frustrated with breastfeeding.  You have painful breasts or nipples.  Your nipples are cracked or bleeding.  Your breasts are red, tender, or warm.  You have a swollen area on either breast.  You have a fever or chills.  You have nausea or vomiting.  You have drainage other than breast milk from your nipples.  Your breasts do not become full before feedings by the fifth day after you give birth.  You feel sad and depressed.  Your baby is too sleepy to eat well.  Your baby is having trouble sleeping.   Your baby is wetting less than 3 diapers in a 24-hour period.  Your baby has less than 3 stools in a 24-hour period.  Your baby's skin or the white part of his or her eyes becomes yellow.   Your baby is not gaining weight by 5 days of age. SEEK IMMEDIATE MEDICAL CARE IF:   Your baby is overly tired (lethargic) and does not want to wake up and feed.  Your baby develops an unexplained fever. Document Released: 01/02/2005 Document Revised:  01/07/2013 Document Reviewed: 06/26/2012 [employer] Patient Information 2015 [employer]. This information is not intended to replace advice given to you by your health care provider. Make sure you discuss any questions you have with your health care provider.  

## 2013-12-30 NOTE — Progress Notes (Signed)
Nausea improved. Needs 1st trimester screening.

## 2013-12-31 ENCOUNTER — Ambulatory Visit (HOSPITAL_COMMUNITY)
Admission: RE | Admit: 2013-12-31 | Discharge: 2013-12-31 | Disposition: A | Discharge status: Home / Self Care | Payer: BC Managed Care – PPO | Source: Ambulatory Visit | Attending: Family Medicine | Admitting: Family Medicine

## 2013-12-31 DIAGNOSIS — Z36 Encounter for antenatal screening of mother: Secondary | ICD-10-CM | POA: Diagnosis present

## 2013-12-31 DIAGNOSIS — Z3682 Encounter for antenatal screening for nuchal translucency: Secondary | ICD-10-CM | POA: Insufficient documentation

## 2013-12-31 DIAGNOSIS — Z3A13 13 weeks gestation of pregnancy: Secondary | ICD-10-CM | POA: Diagnosis not present

## 2013-12-31 DIAGNOSIS — Z3402 Encounter for supervision of normal first pregnancy, second trimester: Secondary | ICD-10-CM

## 2014-01-16 NOTE — L&D Delivery Note (Signed)
Patient is 24 y.o. [id number] [redacted]w[redacted]d admitted in active labor, augmented with pitocin after epidural after no cervical change at 7cm, hx of GBS+   Operative Delivery Note At 6:06 PM a viable female was delivered via .  Presentation: vertex; Position: Left, occiput transverse; Station: +3.  Verbal consent: obtained from patient.  Risks and benefits discussed in detail.  Risks include, but are not limited to the risks of anesthesia, bleeding, infection, damage to maternal tissues, fetal cephalhematoma.  There is also the risk of inability to effect vaginal delivery of the head, or shoulder dystocia that cannot be resolved by established maneuvers, leading to the need for emergency cesarean section.  Patient advised of need of vacuum assisted delivery secondary to recurrent variable deep decelerations, fetal distress.  No pop-offs.  Pushed 1 hour, vacuum assistance over about 5 contractions.  Dr. Macon Large present for vacuum assisted delivery in it's entirety.  NICU called for delivery, baby cried at delivery and delayed cord clamping performed.  Patient felt warm at time of delivery, no documented fever, no foul smelling amniotic fluid.  APGAR: 8, 9; weight 7 lb 8.8 oz (3425 g).   Placenta status: Intact, Spontaneous Pathology.   Cord: 3 vessels with the following complications: None.  Cord pH: 7.218  Anesthesia: Epidural  Instruments: Kiwi vacuum Episiotomy: None Lacerations: 1st degree;Vaginal;Perineal;Labial Suture Repair: 3.0 vicryl rapide Est. Blood Loss (mL): 118  Mom to postpartum.  Baby to Couplet care / Skin to Skin.  Autumn Crawford ROCIO 07/02/2014, 8:15 PM

## 2014-01-22 ENCOUNTER — Telehealth: Payer: Self-pay | Admitting: *Deleted

## 2014-01-22 ENCOUNTER — Other Ambulatory Visit: Payer: Self-pay | Admitting: *Deleted

## 2014-01-22 MED ORDER — PRENATAL 27-0.8 MG PO TABS: 1.0000 | ORAL_TABLET | Freq: Every day | ORAL | Status: AC

## 2014-01-22 NOTE — Telephone Encounter (Signed)
Patient is calling to find out which pharmacy we have noted for her to call in her prenatal vitamins.

## 2014-01-22 NOTE — Telephone Encounter (Signed)
Patient needs rx for prenatal vitamins called into [facility] for her, they do not have the rx at the pharmacy.

## 2014-01-26 ENCOUNTER — Encounter: Payer: Self-pay | Admitting: Obstetrics and Gynecology

## 2014-01-26 ENCOUNTER — Encounter: Payer: BC Managed Care – PPO | Admitting: Obstetrics and Gynecology

## 2014-01-26 ENCOUNTER — Ambulatory Visit (INDEPENDENT_AMBULATORY_CARE_PROVIDER_SITE_OTHER): Payer: Medicaid Other | Admitting: Obstetrics and Gynecology

## 2014-01-26 VITALS — BP 116/75 | HR 65 | Wt 153.0 lb

## 2014-01-26 DIAGNOSIS — Z3402 Encounter for supervision of normal first pregnancy, second trimester: Secondary | ICD-10-CM

## 2014-01-26 DIAGNOSIS — Z36 Encounter for antenatal screening of mother: Secondary | ICD-10-CM

## 2014-01-26 NOTE — Progress Notes (Signed)
Patient is doing well without complaints. Will schedule anatomy ultrasound. Routine obstetrical precautions discussed. AFP today

## 2014-01-28 LAB — ALPHA FETOPROTEIN, MATERNAL
AFP: 44.9 ng/mL
CURR GEST AGE: 17.3 wks.days
MoM for AFP: 0.99
OPEN SPINA BIFIDA: NEGATIVE

## 2014-02-05 ENCOUNTER — Other Ambulatory Visit (HOSPITAL_COMMUNITY): Payer: Self-pay

## 2014-02-05 ENCOUNTER — Ambulatory Visit (HOSPITAL_COMMUNITY): Payer: Medicaid Other

## 2014-02-10 ENCOUNTER — Ambulatory Visit (HOSPITAL_COMMUNITY)
Admission: RE | Admit: 2014-02-10 | Discharge: 2014-02-10 | Disposition: A | Discharge status: Home / Self Care | Payer: Medicaid Other | Source: Ambulatory Visit | Attending: Obstetrics and Gynecology | Admitting: Obstetrics and Gynecology

## 2014-02-10 DIAGNOSIS — Z3A20 20 weeks gestation of pregnancy: Secondary | ICD-10-CM | POA: Insufficient documentation

## 2014-02-10 DIAGNOSIS — Z36 Encounter for antenatal screening of mother: Secondary | ICD-10-CM | POA: Insufficient documentation

## 2014-02-10 DIAGNOSIS — Z3402 Encounter for supervision of normal first pregnancy, second trimester: Secondary | ICD-10-CM

## 2014-02-10 DIAGNOSIS — Z3689 Encounter for other specified antenatal screening: Secondary | ICD-10-CM | POA: Insufficient documentation

## 2014-02-23 ENCOUNTER — Ambulatory Visit (INDEPENDENT_AMBULATORY_CARE_PROVIDER_SITE_OTHER): Payer: Medicaid Other | Admitting: Obstetrics and Gynecology

## 2014-02-23 ENCOUNTER — Encounter: Payer: Self-pay | Admitting: Obstetrics and Gynecology

## 2014-02-23 VITALS — BP 120/64 | HR 59 | Wt 158.0 lb

## 2014-02-23 DIAGNOSIS — Z3402 Encounter for supervision of normal first pregnancy, second trimester: Secondary | ICD-10-CM

## 2014-02-23 NOTE — Progress Notes (Signed)
Patient is doing well without complaints. Anatomy ultrasound results reviewed and explained. Follow up anatomy ultrasound ordered.

## 2014-03-19 ENCOUNTER — Ambulatory Visit (HOSPITAL_COMMUNITY)
Admission: RE | Admit: 2014-03-19 | Discharge: 2014-03-19 | Disposition: A | Discharge status: Home / Self Care | Payer: Medicaid Other | Source: Ambulatory Visit | Attending: Obstetrics and Gynecology | Admitting: Obstetrics and Gynecology

## 2014-03-19 DIAGNOSIS — Z3A24 24 weeks gestation of pregnancy: Secondary | ICD-10-CM | POA: Insufficient documentation

## 2014-03-19 DIAGNOSIS — Z36 Encounter for antenatal screening of mother: Secondary | ICD-10-CM | POA: Diagnosis present

## 2014-03-19 DIAGNOSIS — Z0489 Encounter for examination and observation for other specified reasons: Secondary | ICD-10-CM | POA: Insufficient documentation

## 2014-03-19 DIAGNOSIS — IMO0002 Reserved for concepts with insufficient information to code with codable children: Secondary | ICD-10-CM | POA: Insufficient documentation

## 2014-03-19 DIAGNOSIS — Z3402 Encounter for supervision of normal first pregnancy, second trimester: Secondary | ICD-10-CM

## 2014-03-23 ENCOUNTER — Ambulatory Visit (INDEPENDENT_AMBULATORY_CARE_PROVIDER_SITE_OTHER): Payer: Medicaid Other | Admitting: Obstetrics & Gynecology

## 2014-03-23 ENCOUNTER — Encounter: Payer: Self-pay | Admitting: Obstetrics & Gynecology

## 2014-03-23 VITALS — BP 123/80 | HR 70 | Wt 164.0 lb

## 2014-03-23 DIAGNOSIS — Z3402 Encounter for supervision of normal first pregnancy, second trimester: Secondary | ICD-10-CM

## 2014-03-23 NOTE — Progress Notes (Signed)
Routine visit. Good FM. No problems. Glucola, labs, tdasp at next visit

## 2014-04-07 ENCOUNTER — Encounter (HOSPITAL_COMMUNITY): Payer: Self-pay | Admitting: *Deleted

## 2014-04-07 ENCOUNTER — Inpatient Hospital Stay (HOSPITAL_COMMUNITY)
Admission: AD | Admit: 2014-04-07 | Discharge: 2014-04-07 | Disposition: A | Discharge status: Home / Self Care | Payer: Medicaid Other | Source: Ambulatory Visit | Attending: Family Medicine | Admitting: Family Medicine

## 2014-04-07 ENCOUNTER — Emergency Department (HOSPITAL_COMMUNITY)
Admission: EM | Admit: 2014-04-07 | Discharge: 2014-04-07 | Disposition: A | Discharge status: Home / Self Care | Payer: Medicaid Other | Source: Home / Self Care | Attending: Family Medicine | Admitting: Family Medicine

## 2014-04-07 DIAGNOSIS — Z3A27 27 weeks gestation of pregnancy: Secondary | ICD-10-CM | POA: Diagnosis not present

## 2014-04-07 DIAGNOSIS — L02215 Cutaneous abscess of perineum: Secondary | ICD-10-CM

## 2014-04-07 DIAGNOSIS — L0231 Cutaneous abscess of buttock: Secondary | ICD-10-CM | POA: Diagnosis not present

## 2014-04-07 DIAGNOSIS — O9989 Other specified diseases and conditions complicating pregnancy, childbirth and the puerperium: Secondary | ICD-10-CM | POA: Diagnosis not present

## 2014-04-07 MED ORDER — LIDOCAINE-EPINEPHRINE 1 %-1:100000 IJ SOLN
20.0000 mL | Freq: Once | INTRAMUSCULAR | Status: DC
  Filled 2014-04-07: qty 20

## 2014-04-07 MED ORDER — HYDROMORPHONE HCL 2 MG/ML IJ SOLN
2.0000 mg | Freq: Once | INTRAMUSCULAR | Status: AC
  Administered 2014-04-07: 2 mg via INTRAMUSCULAR

## 2014-04-07 MED ORDER — OXYCODONE-ACETAMINOPHEN 5-325 MG PO TABS
1.0000 | ORAL_TABLET | ORAL | Status: DC | PRN
Start: 2014-04-07 — End: 2014-05-05

## 2014-04-07 NOTE — ED Provider Notes (Signed)
CSN: [id number]     Arrival date & time 04/07/14  1211 History   First MD Initiated Contact with Patient 04/07/14 1330     Chief Complaint  Patient presents with  . Recurrent Skin Infections   (Consider location/radiation/quality/duration/timing/severity/associated sxs/prior Treatment) Patient is a 24 y.o. female presenting with abscess. The history is provided by the patient.  Abscess Location:  Ano-genital Ano-genital abscess location:  R buttock Abscess quality: fluctuance and painful   Red streaking: no   Duration:  5 days Progression:  Worsening Pain details:    Quality:  Throbbing   Severity:  Moderate Chronicity:  New Context comment:  27 wk preg Relieved by:  None tried Associated symptoms: no fever   Risk factors: prior abscess     Past Medical History  Diagnosis Date  . Asthma     childhood  . Trichomonas   . Chlamydia    History reviewed. No pertinent past surgical history. Family History  Problem Relation Age of Onset  . Fibroids Mother   . Hypertension Maternal Grandmother   . Diabetes Maternal Grandmother   . COPD Maternal Grandmother     bone   History  Substance Use Topics  . Smoking status: Never Smoker   . Smokeless tobacco: Never Used  . Alcohol Use: 0.0 oz/week     Comment: weekends   OB History    Gravida Para Term Preterm AB TAB SAB Ectopic Multiple Living   3    2 1 1         Review of Systems  Constitutional: Negative.  Negative for fever.  Skin: Positive for rash.    Allergies  Review of patient's allergies indicates no known allergies.  Home Medications   Prior to Admission medications   Medication Sig Start Date End Date Taking? Authorizing Provider  Doxylamine-Pyridoxine (DICLEGIS) 10-10 MG TBEC Take 2 tabs at bedtime.  If symptoms persist, add another tab in the morning and another in the afternoon for 4 tabs/day. 11/26/13   Wilmer FloorLisa A Leftwich-Kirby, CNM  Prenatal Vit-Fe Fumarate-FA (MULTIVITAMIN-PRENATAL) 27-0.8 MG TABS  tablet Take 1 tablet by mouth daily at 12 noon. 01/22/14   [name], MD   BP 122/81 mmHg  Pulse 83  Temp(Src) 97.7 F (36.5 C) (Oral)  Resp 18  SpO2 98%  LMP 09/30/2013 Physical Exam  Constitutional: She is oriented to person, place, and time. She appears well-developed and well-nourished. She appears distressed.  Neurological: She is alert and oriented to person, place, and time.  Skin: Skin is warm and dry.  Tender fluctuant 4cm right upper gluteal / inner thigh abscess. Pt in mod distress to exam.  Nursing note and vitals reviewed.   ED Course  Procedures (including critical care time) Labs Review Labs Reviewed - No data to display  Imaging Review No results found.   MDM   1. Gluteal abscess    [facility] right pelvic abscess.    Linna HoffJames D Kindl, MD 04/07/14 [id number]

## 2014-04-07 NOTE — MAU Note (Signed)
Abscess on rt buttock, first noted on Thurs.  [facility] sent [facility] here, "because she was so tender, maybe we could sedate her"

## 2014-04-07 NOTE — Discharge Instructions (Signed)

## 2014-04-07 NOTE — ED Notes (Signed)
Pt  Has    A  Boil      Crease  Of  Her  Buttock  X  5  Days    -  She  Reports  The  Area  Is  painfull  And  Swollen      - she reports  Is   27  Weeks  preg      She  denys  Any  Ob gyn  Symptoms  Such as  Bleeding  Or  Discharge

## 2014-04-07 NOTE — Progress Notes (Signed)
Pt states pain is 0 in this position but it increases to 10 when she stands up or moves

## 2014-04-07 NOTE — MAU Note (Signed)
Pt crying because she of pain that she anticipates for lancing of abcess.

## 2014-04-07 NOTE — Discharge Instructions (Signed)
[facility] to [facility] for care of abscess.

## 2014-04-07 NOTE — MAU Provider Note (Signed)
History     CSN: [id number]  Arrival date and time: 04/07/14 1502   None     Chief Complaint  Patient presents with  . Abscess   HPI  Patient is 24 y.o. [id number] here with complaints of abscess.  R buttock abscess x one week, gradually increasing in size. Pain/tenderness.  No difficulty with defecation or urination.  No fevers. Worse with sitting.  [facility], referred due to pregnancy. +FM, denies LOF, VB, contractions, vaginal discharge.   Past Medical History  Diagnosis Date  . Asthma     childhood  . Trichomonas   . Chlamydia     No past surgical history on file.  Family History  Problem Relation Age of Onset  . Fibroids Mother   . Hypertension Maternal Grandmother   . Diabetes Maternal Grandmother   . COPD Maternal Grandmother     bone    History  Substance Use Topics  . Smoking status: Never Smoker   . Smokeless tobacco: Never Used  . Alcohol Use: 0.0 oz/week     Comment: weekends    Allergies: No Known Allergies  Prescriptions prior to admission  Medication Sig Dispense Refill Last Dose  . Doxylamine-Pyridoxine (DICLEGIS) 10-10 MG TBEC Take 2 tabs at bedtime.  If symptoms persist, add another tab in the morning and another in the afternoon for 4 tabs/day. 100 tablet 2 Taking  . Prenatal Vit-Fe Fumarate-FA (MULTIVITAMIN-PRENATAL) 27-0.8 MG TABS tablet Take 1 tablet by mouth daily at 12 noon. 30 each 11 Taking    Review of Systems  Constitutional: Negative for fever and chills.  Eyes: Negative for blurred vision.  Respiratory: Negative for cough and shortness of breath.   Cardiovascular: Negative for leg swelling.  Gastrointestinal: Negative for nausea, vomiting, abdominal pain and diarrhea.  Genitourinary: Negative for dysuria and hematuria.  Neurological: Negative for headaches.      Physical Exam   Blood pressure 120/68, pulse 86, temperature 98.3 F (36.8 C), temperature source Oral, resp. rate 16, last menstrual  period 09/30/2013.  Physical Exam  Constitutional: She appears well-developed and well-nourished. She appears distressed.  HENT:  Head: Normocephalic and atraumatic.  Eyes: Conjunctivae are normal. Pupils are equal, round, and reactive to light.  Neck: Normal range of motion.  Cardiovascular: Normal rate.   Respiratory: Effort normal. No respiratory distress.  GI: Soft. She exhibits no distension. There is no tenderness.  Musculoskeletal: Normal range of motion. She exhibits no edema.  Neurological: She is alert. Coordination normal.  Skin: Skin is warm and dry. She is not diaphoretic.   Firm, tender, fluctuant mass of R buttock, posterior to rectum.  Dimensions 4 inches x 1.5 inches, 3/4 inch deep.     MAU Course  Procedures  Procedure note Procedure: Incision and drainage.  Performed 1547 on 04/07/2014 by Bluford MainAmber Henson, MD    Supervised by Dr Fredirick LatheKristy Seda Kronberg MD Indication: Right buttock abscess, posterior to rectum.  Dimensions 4 inches x 1.5 inches, 3/4 inch deep.   Time out/risks and benefits discussed with patient to include infection, bleeding, need for repeat procedure.   Anesthetic: 1% lidocaine with epi, 20mL  1.5 inchncision with #10 scalpel, loculations vigorously removed with sterile blunt end q-tip Pressure applied until completed expulsion of dark blood clots, thick white fluid. Wound packed with 4 inches iodoform gauze Adequate hemostasis achieved.  Patient advised to slowly remove packing 1/2 inch per day; have wound examined at follow up OB appt in one week.  Assessment and Plan  A: Patient is 24 y.o. [id number] [redacted]w[redacted]d here with complaints of R buttock abscess  P: Incision and Drainage performed       Dilaudid IM for pain/anxiety      Discharged with Rx for percocet x 10      Follow up with OB at scheduled appt in one week for re-evaluation      Return to hospital precautions discussed with patient, who voiced understanding of plan.   Henson,Amber 04/07/2014,  3:47 PM   OB fellow attestation:  I have seen and examined this patient; I agree with above documentation in the resident's note.   Nakeshia TYNIA WIERS is a 24 y.o. G3P0020 reporting abscess +FM, denies LOF, VB, contractions, vaginal discharge.  PE: BP 113/61 mmHg  Pulse 64  Temp(Src) 98.3 F (36.8 C) (Oral)  Resp 16  LMP 09/30/2013 Gen: very anxious Resp: normal effort, no distress Abd: gravid GU: abscess, see above for size  ROS, labs, PMH reviewed NST reactive  Plan: - continue routine follow up in OB clinic -  dilaudid IM for pre-procedural pain control, rx perocets  I thought antibiotics were prescribed, however we did not.  I called patient 3d post-procedure, she reports doing very well, pain much better, packing out.  Has follow up on 04/12/2013.  No antibiotics called in, patient agreeable with plan  Perry Mount, MD 2:59 PM

## 2014-04-08 ENCOUNTER — Telehealth: Payer: Self-pay | Admitting: Advanced Practice Midwife

## 2014-04-08 NOTE — Telephone Encounter (Signed)
Pt called because she lost her discharge paperwork and cannot remember how she is supposed to treat her recent abscess.  Reviewed chart and discussed with Dr Loreta AveAcosta.  Pt to pull out 1/2 inch of packing each day, use warm compresses, and f/u in [facility] as scheduled next week.  Pt questions answered.  Pt asked for note for work.  Note to miss tomorrow and Friday written and left at MAU front desk for pt family member to pick up.

## 2014-04-13 ENCOUNTER — Ambulatory Visit (INDEPENDENT_AMBULATORY_CARE_PROVIDER_SITE_OTHER): Payer: Medicaid Other | Admitting: Obstetrics & Gynecology

## 2014-04-13 ENCOUNTER — Ambulatory Visit: Payer: BC Managed Care – PPO | Admitting: Obstetrics

## 2014-04-13 VITALS — BP 120/80 | HR 58 | Wt 166.0 lb

## 2014-04-13 DIAGNOSIS — Z36 Encounter for antenatal screening of mother: Secondary | ICD-10-CM | POA: Diagnosis not present

## 2014-04-13 DIAGNOSIS — Z23 Encounter for immunization: Secondary | ICD-10-CM

## 2014-04-13 DIAGNOSIS — Z3403 Encounter for supervision of normal first pregnancy, third trimester: Secondary | ICD-10-CM

## 2014-04-13 LAB — CBC WITH DIFFERENTIAL/PLATELET
BASOS ABS: 0 10*3/uL (ref 0.0–0.1)
BASOS PCT: 0 % (ref 0–1)
EOS PCT: 1 % (ref 0–5)
Eosinophils Absolute: 0.1 10*3/uL (ref 0.0–0.7)
HCT: 38.3 % (ref 36.0–46.0)
Hemoglobin: 13.1 g/dL (ref 12.0–15.0)
LYMPHS PCT: 24 % (ref 12–46)
Lymphs Abs: 2.4 10*3/uL (ref 0.7–4.0)
MCH: 29.6 pg (ref 26.0–34.0)
MCHC: 34.2 g/dL (ref 30.0–36.0)
MCV: 86.5 fL (ref 78.0–100.0)
MONO ABS: 0.6 10*3/uL (ref 0.1–1.0)
MPV: 9.9 fL (ref 8.6–12.4)
Monocytes Relative: 6 % (ref 3–12)
Neutro Abs: 6.8 10*3/uL (ref 1.7–7.7)
Neutrophils Relative %: 69 % (ref 43–77)
PLATELETS: 329 10*3/uL (ref 150–400)
RBC: 4.43 MIL/uL (ref 3.87–5.11)
RDW: 13.9 % (ref 11.5–15.5)
WBC: 9.8 10*3/uL (ref 4.0–10.5)

## 2014-04-13 NOTE — Progress Notes (Signed)
Third trimester labs and Tdap today. No other complaints or concerns. Labor and fetal movement precautions reviewed. 

## 2014-04-13 NOTE — Patient Instructions (Signed)
Return to clinic for any obstetric concerns or go to MAU for evaluation  

## 2014-04-14 LAB — RPR

## 2014-04-14 LAB — HIV ANTIBODY (ROUTINE TESTING W REFLEX): HIV 1&2 Ab, 4th Generation: NONREACTIVE

## 2014-04-14 LAB — GLUCOSE TOLERANCE, 1 HOUR (50G) W/O FASTING: Glucose, 1 Hour GTT: 98 mg/dL (ref 70–140)

## 2014-04-30 ENCOUNTER — Ambulatory Visit: Payer: Self-pay | Admitting: Obstetrics

## 2014-05-01 ENCOUNTER — Encounter (HOSPITAL_COMMUNITY): Payer: Self-pay | Admitting: *Deleted

## 2014-05-01 ENCOUNTER — Inpatient Hospital Stay (HOSPITAL_COMMUNITY)
Admission: AD | Admit: 2014-05-01 | Discharge: 2014-05-01 | Disposition: A | Discharge status: Home / Self Care | Payer: Medicaid Other | Source: Ambulatory Visit | Attending: Obstetrics & Gynecology | Admitting: Obstetrics & Gynecology

## 2014-05-01 DIAGNOSIS — Z3A30 30 weeks gestation of pregnancy: Secondary | ICD-10-CM | POA: Diagnosis not present

## 2014-05-01 DIAGNOSIS — L0231 Cutaneous abscess of buttock: Secondary | ICD-10-CM | POA: Insufficient documentation

## 2014-05-01 DIAGNOSIS — O9989 Other specified diseases and conditions complicating pregnancy, childbirth and the puerperium: Secondary | ICD-10-CM | POA: Diagnosis not present

## 2014-05-01 DIAGNOSIS — Z3A31 31 weeks gestation of pregnancy: Secondary | ICD-10-CM

## 2014-05-01 MED ORDER — CEPHALEXIN 500 MG PO CAPS: 500.0000 mg | ORAL_CAPSULE | Freq: Four times a day (QID) | ORAL | Status: DC

## 2014-05-01 MED ORDER — LIDOCAINE HCL 2 % EX GEL: 1.0000 "application " | CUTANEOUS | Status: DC | PRN

## 2014-05-01 NOTE — Discharge Instructions (Signed)
Abscess °An abscess (boil or furuncle) is an infected area on or under the skin. This area is filled with yellowish-white fluid (pus) and other material (debris). °HOME CARE  °· Only take medicines as told by your doctor. °· If you were given antibiotic medicine, take it as directed. Finish the medicine even if you start to feel better. °· If gauze is used, follow your doctor's directions for changing the gauze. °· To avoid spreading the infection: °¨ Keep your abscess covered with a bandage. °¨ Wash your hands well. °¨ Do not share personal care items, towels, or whirlpools with others. °¨ Avoid skin contact with others. °· Keep your skin and clothes clean around the abscess. °· Keep all doctor visits as told. °GET HELP RIGHT AWAY IF:  °· You have more pain, puffiness (swelling), or redness in the wound site. °· You have more fluid or blood coming from the wound site. °· You have muscle aches, chills, or you feel sick. °· You have a fever. °MAKE SURE YOU:  °· Understand these instructions. °· Will watch your condition. °· Will get help right away if you are not doing well or get worse. °Document Released: 06/21/2007 Document Revised: 07/04/2011 Document Reviewed: 03/17/2011 °Exit[employer]® Patient Information ©2015 [employer]. This information is not intended to replace advice given to you by your health care provider. Make sure you discuss any questions you have with your health care provider. ° °

## 2014-05-01 NOTE — MAU Note (Signed)
Seen in March for boil, under rt buttock. Was healing well, started getting larger, painful and hard again - 2 days ago.

## 2014-05-01 NOTE — MAU Provider Note (Signed)
History     CSN: [id number]  Arrival date and time: 05/01/14 1205    Chief Complaint  Patient presents with  . Recurrent Skin Infections   HPI: Autumn Crawford is a 24 year old G3P0020, currently [redacted]w[redacted]d who presents to [facility] for a recurrent abscess along her right buttocks. She was seen here on 04/07/2014 for the same issue and the lesion was I&D at that time. She was followed up with 3 days after the procedure to see how the lesion was doing since they did not prescribe antibiotics and she reported it had mostly gone away. Her mother removed all of the packing at once 3 days after the procedure. She reports following up at her prenatal visit the following week but did not mention the abscess since it was not bothering her at that point and worrying if it was still present on exam that they might have to drain it again. She reports it mostly went away but that 2 days ago she began to have recurrent pain in that area. She reports it has never actively drained and that she has not tried any warm compresses or warm baths. She currently rates her pain as a 6-7/10 and says it is worse with movement or lying directly on the lesion. She has only had 1 other abscess before and that was in her right underarm area.   OB History    Gravida Para Term Preterm AB TAB SAB Ectopic Multiple Living   0      Past Medical History  Diagnosis Date  . Asthma     childhood  . Trichomonas   . Chlamydia     History reviewed. No pertinent past surgical history.  Family History  Problem Relation Age of Onset  . Fibroids Mother   . Hypertension Maternal Grandmother   . Diabetes Maternal Grandmother   . COPD Maternal Grandmother     bone    History  Substance Use Topics  . Smoking status: Never Smoker   . Smokeless tobacco: Never Used  . Alcohol Use: 0.0 oz/week     Comment: weekends    Allergies: No Known Allergies  Prescriptions prior to admission  Medication Sig  Dispense Refill Last Dose  . oxyCODONE-acetaminophen (ROXICET) 5-325 MG per tablet Take 1 tablet by mouth every 4 (four) hours as needed for severe pain. 10 tablet 0 Taking  . Prenatal Vit-Fe Fumarate-FA (MULTIVITAMIN-PRENATAL) 27-0.8 MG TABS tablet Take 1 tablet by mouth daily at 12 noon. 30 each 11 Taking    Review of Systems  Constitutional: Negative for fever and chills.  HENT: Negative for congestion and sore throat.   Respiratory: Negative for shortness of breath and wheezing.   Cardiovascular: Negative for chest pain, palpitations and leg swelling.  Gastrointestinal: Negative for nausea, vomiting, diarrhea and constipation.  Genitourinary: Negative for dysuria.  Musculoskeletal: Negative for back pain and falls.  Neurological: Negative for dizziness, tremors, seizures and loss of consciousness.   Physical Exam   Blood pressure 126/67, pulse 72, temperature 97.5 F (36.4 C), temperature source Oral, resp. rate 18, height  (1.626 m), weight 77.565 kg (171 lb), last menstrual period 09/30/2013.  Physical Exam  Nursing note and vitals reviewed. Constitutional: She is oriented to person, place, and time. She appears well-developed and well-nourished. No distress.  HENT:  Head: Normocephalic and atraumatic.  Eyes: Pupils are equal, round, and reactive to light. Right eye exhibits no discharge. Left eye  exhibits no discharge.  Neck: Normal range of motion. Neck supple. No thyromegaly present.  Cardiovascular: Normal rate, regular rhythm, normal heart sounds and intact distal pulses.  Exam reveals no gallop and no friction rub.   No murmur heard. Respiratory: Effort normal and breath sounds normal. No stridor. No respiratory distress. She has no wheezes. She has no rales. She exhibits no tenderness.  GI: Soft. Bowel sounds are normal. She exhibits no distension and no mass. There is no tenderness. There is no rebound and no guarding.  Gravid  Musculoskeletal: Normal range of  motion. She exhibits no edema or tenderness.  Lymphadenopathy:    She has no cervical adenopathy.  Neurological: She is alert and oriented to person, place, and time.  Skin: Skin is warm and dry. She is not diaphoretic. No erythema.  4cm x 2cm indurated lesion along right gluteal region with 1cm x 1cm erythematous center, not actively draining.    MAU Course  Procedures  MDM Lesion is not open nor actively draining. Spoke with Dr. Debroah LoopArnold about the patient and he recommended trying antibiotics and not draining the abscess this time.  Assessment and Plan  A: Abscess of right buttock P:  Prescribed Keflex 500mg  4 times a day for 7 days  Prescribed topical Lidocaine for pain relief.  Has a prenatal visit on 05/04/2014 and will follow-up at that time.  Wandra MannanManning, Brittany, PA-Student 05/01/2014, 1:27 PM   CNM attestation:  I have seen and examined this patient; I agree with above documentation in the PA student's note.   Autumn Crawford is a 24 y.o. G3P0020 reporting pain with R buttock abscess +FM, denies LOF, VB, contractions, vaginal discharge.  PE: BP 109/55 mmHg  Pulse 74  Temp(Src) 97.5 F (36.4 C) (Oral)  Resp 18  Ht 5\' 4"  (1.626 m)  Wt 77.565 kg (171 lb)  BMI 29.34 kg/m2  LMP 09/30/2013 Gen: calm comfortable, NAD Resp: normal effort, no distress Abd: gravid R buttock: indurated area approx 2x4cm, not draining  ROS,PMH reviewed NST reactive +accels, no decels No ctx per toco  Plan: - Rx Keflex 500 QID x 7d & 1% Lido gel - Warm compresses/soaks recommended - continue routine follow up in OB clinic  Cam HaiSHAW, Jesiah Grismer, CNM 2:46 PM  05/01/2014

## 2014-05-04 ENCOUNTER — Encounter: Payer: Medicaid Other | Admitting: Obstetrics & Gynecology

## 2014-05-04 ENCOUNTER — Ambulatory Visit: Payer: Self-pay | Admitting: Obstetrics

## 2014-05-05 ENCOUNTER — Ambulatory Visit (INDEPENDENT_AMBULATORY_CARE_PROVIDER_SITE_OTHER): Payer: Medicaid Other | Admitting: Family Medicine

## 2014-05-05 VITALS — BP 124/81 | HR 92 | Wt 176.0 lb

## 2014-05-05 DIAGNOSIS — Z3403 Encounter for supervision of normal first pregnancy, third trimester: Secondary | ICD-10-CM

## 2014-05-05 NOTE — Progress Notes (Signed)
Normal labs Feels well No issues today Discussed work and maternity leave today

## 2014-05-05 NOTE — Patient Instructions (Signed)
Third Trimester of Pregnancy The third trimester is from week 29 through week 42, months 7 through 9. The third trimester is a time when the fetus is growing rapidly. At the end of the ninth month, the fetus is about 20 inches in length and weighs 6-10 pounds.  BODY CHANGES Your body goes through many changes during pregnancy. The changes vary from woman to woman.   Your weight will continue to increase. You can expect to gain 25-35 pounds (11-16 kg) by the end of the pregnancy.  You may begin to get stretch marks on your hips, abdomen, and breasts.  You may urinate more often because the fetus is moving lower into your pelvis and pressing on your bladder.  You may develop or continue to have heartburn as a result of your pregnancy.  You may develop constipation because certain hormones are causing the muscles that push waste through your intestines to slow down.  You may develop hemorrhoids or swollen, bulging veins (varicose veins).  You may have pelvic pain because of the weight gain and pregnancy hormones relaxing your joints between the bones in your pelvis. Backaches may result from overexertion of the muscles supporting your posture.  You may have changes in your hair. These can include thickening of your hair, rapid growth, and changes in texture. Some women also have hair loss during or after pregnancy, or hair that feels dry or thin. Your hair will most likely return to normal after your baby is born.  Your breasts will continue to grow and be tender. A yellow discharge may leak from your breasts called colostrum.  Your belly button may stick out.  You may feel short of breath because of your expanding uterus.  You may notice the fetus "dropping," or moving lower in your abdomen.  You may have a bloody mucus discharge. This usually occurs a few days to a week before labor begins.  Your cervix becomes thin and soft (effaced) near your due date. WHAT TO EXPECT AT YOUR  PRENATAL EXAMS  You will have prenatal exams every 2 weeks until week 36. Then, you will have weekly prenatal exams. During a routine prenatal visit:  You will be weighed to make sure you and the fetus are growing normally.  Your blood pressure is taken.  Your abdomen will be measured to track your baby's growth.  The fetal heartbeat will be listened to.  Any test results from the previous visit will be discussed.  You may have a cervical check near your due date to see if you have effaced. At around 36 weeks, your caregiver will check your cervix. At the same time, your caregiver will also perform a test on the secretions of the vaginal tissue. This test is to determine if a type of bacteria, Group B streptococcus, is present. Your caregiver will explain this further. Your caregiver may ask you:  What your birth plan is.  How you are feeling.  If you are feeling the baby move.  If you have had any abnormal symptoms, such as leaking fluid, bleeding, severe headaches, or abdominal cramping.  If you have any questions. Other tests or screenings that may be performed during your third trimester include:  Blood tests that check for low iron levels (anemia).  Fetal testing to check the health, activity level, and growth of the fetus. Testing is done if you have certain medical conditions or if there are problems during the pregnancy. FALSE LABOR You may feel small, irregular contractions that   eventually go away. These are called Braxton Hicks contractions, or false labor. Contractions may last for hours, days, or even weeks before true labor sets in. If contractions come at regular intervals, intensify, or become painful, it is best to be seen by your caregiver.  SIGNS OF LABOR   Menstrual-like cramps.  Contractions that are 5 minutes apart or less.  Contractions that start on the top of the uterus and spread down to the lower abdomen and back.  A sense of increased pelvic  pressure or back pain.  A watery or bloody mucus discharge that comes from the vagina. If you have any of these signs before the 37th week of pregnancy, call your caregiver right away. You need to go to the hospital to get checked immediately. HOME CARE INSTRUCTIONS   Avoid all smoking, herbs, alcohol, and unprescribed drugs. These chemicals affect the formation and growth of the baby.  Follow your caregiver's instructions regarding medicine use. There are medicines that are either safe or unsafe to take during pregnancy.  Exercise only as directed by your caregiver. Experiencing uterine cramps is a good sign to stop exercising.  Continue to eat regular, healthy meals.  Wear a good support bra for breast tenderness.  Do not use hot tubs, steam rooms, or saunas.  Wear your seat belt at all times when driving.  Avoid raw meat, uncooked cheese, cat litter boxes, and soil used by cats. These carry germs that can cause birth defects in the baby.  Take your prenatal vitamins.  Try taking a stool softener (if your caregiver approves) if you develop constipation. Eat more high-fiber foods, such as fresh vegetables or fruit and whole grains. Drink plenty of fluids to keep your urine clear or pale yellow.  Take warm sitz baths to soothe any pain or discomfort caused by hemorrhoids. Use hemorrhoid cream if your caregiver approves.  If you develop varicose veins, wear support hose. Elevate your feet for 15 minutes, 3-4 times a day. Limit salt in your diet.  Avoid heavy lifting, wear low heal shoes, and practice good posture.  Rest a lot with your legs elevated if you have leg cramps or low back pain.  Visit your dentist if you have not gone during your pregnancy. Use a soft toothbrush to brush your teeth and be gentle when you floss.  A sexual relationship may be continued unless your caregiver directs you otherwise.  Do not travel far distances unless it is absolutely necessary and only  with the approval of your caregiver.  Take prenatal classes to understand, practice, and ask questions about the labor and delivery.  Make a trial run to the hospital.  Pack your hospital bag.  Prepare the baby's nursery.  Continue to go to all your prenatal visits as directed by your caregiver. SEEK MEDICAL CARE IF:  You are unsure if you are in labor or if your water has broken.  You have dizziness.  You have mild pelvic cramps, pelvic pressure, or nagging pain in your abdominal area.  You have persistent nausea, vomiting, or diarrhea.  You have a bad smelling vaginal discharge.  You have pain with urination. SEEK IMMEDIATE MEDICAL CARE IF:   You have a fever.  You are leaking fluid from your vagina.  You have spotting or bleeding from your vagina.  You have severe abdominal cramping or pain.  You have rapid weight loss or gain.  You have shortness of breath with chest pain.  You notice sudden or extreme swelling   of your face, hands, ankles, feet, or legs.  You have not felt your baby move in over an hour.  You have severe headaches that do not go away with medicine.  You have vision changes. Document Released: 12/27/2000 Document Revised: 01/07/2013 Document Reviewed: 03/05/2012 [employer] Patient Information 2015 [employer]. This information is not intended to replace advice given to you by your health care provider. Make sure you discuss any questions you have with your health care provider.  Breastfeeding Deciding to breastfeed is one of the best choices you can make for you and your baby. A change in hormones during pregnancy causes your breast tissue to grow and increases the number and size of your milk ducts. These hormones also allow proteins, sugars, and fats from your blood supply to make breast milk in your milk-producing glands. Hormones prevent breast milk from being released before your baby is born as well as prompt milk flow after birth. Once  breastfeeding has begun, thoughts of your baby, as well as his or her sucking or crying, can stimulate the release of milk from your milk-producing glands.  BENEFITS OF BREASTFEEDING For Your Baby  Your first milk (colostrum) helps your baby's digestive system function better.   There are antibodies in your milk that help your baby fight off infections.   Your baby has a lower incidence of asthma, allergies, and sudden infant death syndrome.   The nutrients in breast milk are better for your baby than infant formulas and are designed uniquely for your baby's needs.   Breast milk improves your baby's brain development.   Your baby is less likely to develop other conditions, such as childhood obesity, asthma, or type 2 diabetes mellitus.  For You   Breastfeeding helps to create a very special bond between you and your baby.   Breastfeeding is convenient. Breast milk is always available at the correct temperature and costs nothing.   Breastfeeding helps to burn calories and helps you lose the weight gained during pregnancy.   Breastfeeding makes your uterus contract to its prepregnancy size faster and slows bleeding (lochia) after you give birth.   Breastfeeding helps to lower your risk of developing type 2 diabetes mellitus, osteoporosis, and breast or ovarian cancer later in life. SIGNS THAT YOUR BABY IS HUNGRY Early Signs of Hunger  Increased alertness or activity.  Stretching.  Movement of the head from side to side.  Movement of the head and opening of the mouth when the corner of the mouth or cheek is stroked (rooting).  Increased sucking sounds, smacking lips, cooing, sighing, or squeaking.  Hand-to-mouth movements.  Increased sucking of fingers or hands. Late Signs of Hunger  Fussing.  Intermittent crying. Extreme Signs of Hunger Signs of extreme hunger will require calming and consoling before your baby will be able to breastfeed successfully. Do not  wait for the following signs of extreme hunger to occur before you initiate breastfeeding:   Restlessness.  A loud, strong cry.   Screaming. BREASTFEEDING BASICS Breastfeeding Initiation  Find a comfortable place to sit or lie down, with your neck and back well supported.  Place a pillow or rolled up blanket under your baby to bring him or her to the level of your breast (if you are seated). Nursing pillows are specially designed to help support your arms and your baby while you breastfeed.  Make sure that your baby's abdomen is facing your abdomen.   Gently massage your breast. With your fingertips, massage from your chest   wall toward your nipple in a circular motion. This encourages milk flow. You may need to continue this action during the feeding if your milk flows slowly.  Support your breast with 4 fingers underneath and your thumb above your nipple. Make sure your fingers are well away from your nipple and your baby's mouth.   Stroke your baby's lips gently with your finger or nipple.   When your baby's mouth is open wide enough, quickly bring your baby to your breast, placing your entire nipple and as much of the colored area around your nipple (areola) as possible into your baby's mouth.   More areola should be visible above your baby's upper lip than below the lower lip.   Your baby's tongue should be between his or her lower gum and your breast.   Ensure that your baby's mouth is correctly positioned around your nipple (latched). Your baby's lips should create a seal on your breast and be turned out (everted).  It is common for your baby to suck about 2-3 minutes in order to start the flow of breast milk. Latching Teaching your baby how to latch on to your breast properly is very important. An improper latch can cause nipple pain and decreased milk supply for you and poor weight gain in your baby. Also, if your baby is not latched onto your nipple properly, he or she  may swallow some air during feeding. This can make your baby fussy. Burping your baby when you switch breasts during the feeding can help to get rid of the air. However, teaching your baby to latch on properly is still the best way to prevent fussiness from swallowing air while breastfeeding. Signs that your baby has successfully latched on to your nipple:    Silent tugging or silent sucking, without causing you pain.   Swallowing heard between every 3-4 sucks.    Muscle movement above and in front of his or her ears while sucking.  Signs that your baby has not successfully latched on to nipple:   Sucking sounds or smacking sounds from your baby while breastfeeding.  Nipple pain. If you think your baby has not latched on correctly, slip your finger into the corner of your baby's mouth to break the suction and place it between your baby's gums. Attempt breastfeeding initiation again. Signs of Successful Breastfeeding Signs from your baby:   A gradual decrease in the number of sucks or complete cessation of sucking.   Falling asleep.   Relaxation of his or her body.   Retention of a small amount of milk in his or her mouth.   Letting go of your breast by himself or herself. Signs from you:  Breasts that have increased in firmness, weight, and size 1-3 hours after feeding.   Breasts that are softer immediately after breastfeeding.  Increased milk volume, as well as a change in milk consistency and color by the fifth day of breastfeeding.   Nipples that are not sore, cracked, or bleeding. Signs That Your Baby is Getting Enough Milk  Wetting at least 3 diapers in a 24-hour period. The urine should be clear and pale yellow by age 5 days.  At least 3 stools in a 24-hour period by age 5 days. The stool should be soft and yellow.  At least 3 stools in a 24-hour period by age 7 days. The stool should be seedy and yellow.  No loss of weight greater than 10% of birth weight  during the first 3   days of age.  Average weight gain of 4-7 ounces (113-198 g) per week after age 4 days.  Consistent daily weight gain by age 5 days, without weight loss after the age of 2 weeks. After a feeding, your baby may spit up a small amount. This is common. BREASTFEEDING FREQUENCY AND DURATION Frequent feeding will help you make more milk and can prevent sore nipples and breast engorgement. Breastfeed when you feel the need to reduce the fullness of your breasts or when your baby shows signs of hunger. This is called "breastfeeding on demand." Avoid introducing a pacifier to your baby while you are working to establish breastfeeding (the first 4-6 weeks after your baby is born). After this time you may choose to use a pacifier. Research has shown that pacifier use during the first year of a baby's life decreases the risk of sudden infant death syndrome (SIDS). Allow your baby to feed on each breast as long as he or she wants. Breastfeed until your baby is finished feeding. When your baby unlatches or falls asleep while feeding from the first breast, offer the second breast. Because newborns are often sleepy in the first few weeks of life, you may need to awaken your baby to get him or her to feed. Breastfeeding times will vary from baby to baby. However, the following rules can serve as a guide to help you ensure that your baby is properly fed:  Newborns (babies 4 weeks of age or younger) may breastfeed every 1-3 hours.  Newborns should not go longer than 3 hours during the day or 5 hours during the night without breastfeeding.  You should breastfeed your baby a minimum of 8 times in a 24-hour period until you begin to introduce solid foods to your baby at around 6 months of age. BREAST MILK PUMPING Pumping and storing breast milk allows you to ensure that your baby is exclusively fed your breast milk, even at times when you are unable to breastfeed. This is especially important if you are  going back to work while you are still breastfeeding or when you are not able to be present during feedings. Your lactation consultant can give you guidelines on how long it is safe to store breast milk.  A breast pump is a machine that allows you to pump milk from your breast into a sterile bottle. The pumped breast milk can then be stored in a refrigerator or freezer. Some breast pumps are operated by hand, while others use electricity. Ask your lactation consultant which type will work best for you. Breast pumps can be purchased, but some hospitals and breastfeeding support groups lease breast pumps on a monthly basis. A lactation consultant can teach you how to hand express breast milk, if you prefer not to use a pump.  CARING FOR YOUR BREASTS WHILE YOU BREASTFEED Nipples can become dry, cracked, and sore while breastfeeding. The following recommendations can help keep your breasts moisturized and healthy:  Avoid using soap on your nipples.   Wear a supportive bra. Although not required, special nursing bras and tank tops are designed to allow access to your breasts for breastfeeding without taking off your entire bra or top. Avoid wearing underwire-style bras or extremely tight bras.  Air dry your nipples for 3-4minutes after each feeding.   Use only cotton bra pads to absorb leaked breast milk. Leaking of breast milk between feedings is normal.   Use lanolin on your nipples after breastfeeding. Lanolin helps to maintain your skin's   normal moisture barrier. If you use pure lanolin, you do not need to wash it off before feeding your baby again. Pure lanolin is not toxic to your baby. You may also hand express a few drops of breast milk and gently massage that milk into your nipples and allow the milk to air dry. In the first few weeks after giving birth, some women experience extremely full breasts (engorgement). Engorgement can make your breasts feel heavy, warm, and tender to the touch.  Engorgement peaks within 3-5 days after you give birth. The following recommendations can help ease engorgement:  Completely empty your breasts while breastfeeding or pumping. You may want to start by applying warm, moist heat (in the shower or with warm water-soaked hand towels) just before feeding or pumping. This increases circulation and helps the milk flow. If your baby does not completely empty your breasts while breastfeeding, pump any extra milk after he or she is finished.  Wear a snug bra (nursing or regular) or tank top for 1-2 days to signal your body to slightly decrease milk production.  Apply ice packs to your breasts, unless this is too uncomfortable for you.  Make sure that your baby is latched on and positioned properly while breastfeeding. If engorgement persists after 48 hours of following these recommendations, contact your health care provider or a lactation consultant. OVERALL HEALTH CARE RECOMMENDATIONS WHILE BREASTFEEDING  Eat healthy foods. Alternate between meals and snacks, eating 3 of each per day. Because what you eat affects your breast milk, some of the foods may make your baby more irritable than usual. Avoid eating these foods if you are sure that they are negatively affecting your baby.  Drink milk, fruit juice, and water to satisfy your thirst (about 10 glasses a day).   Rest often, relax, and continue to take your prenatal vitamins to prevent fatigue, stress, and anemia.  Continue breast self-awareness checks.  Avoid chewing and smoking tobacco.  Avoid alcohol and drug use. Some medicines that may be harmful to your baby can pass through breast milk. It is important to ask your health care provider before taking any medicine, including all over-the-counter and prescription medicine as well as vitamin and herbal supplements. It is possible to become pregnant while breastfeeding. If birth control is desired, ask your health care provider about options that  will be safe for your baby. SEEK MEDICAL CARE IF:   You feel like you want to stop breastfeeding or have become frustrated with breastfeeding.  You have painful breasts or nipples.  Your nipples are cracked or bleeding.  Your breasts are red, tender, or warm.  You have a swollen area on either breast.  You have a fever or chills.  You have nausea or vomiting.  You have drainage other than breast milk from your nipples.  Your breasts do not become full before feedings by the fifth day after you give birth.  You feel sad and depressed.  Your baby is too sleepy to eat well.  Your baby is having trouble sleeping.   Your baby is wetting less than 3 diapers in a 24-hour period.  Your baby has less than 3 stools in a 24-hour period.  Your baby's skin or the white part of his or her eyes becomes yellow.   Your baby is not gaining weight by 5 days of age. SEEK IMMEDIATE MEDICAL CARE IF:   Your baby is overly tired (lethargic) and does not want to wake up and feed.  Your baby   develops an unexplained fever. Document Released: 01/02/2005 Document Revised: 01/07/2013 Document Reviewed: 06/26/2012 [employer] Patient Information 2015 [employer]. This information is not intended to replace advice given to you by your health care provider. Make sure you discuss any questions you have with your health care provider.  

## 2014-05-18 ENCOUNTER — Ambulatory Visit (INDEPENDENT_AMBULATORY_CARE_PROVIDER_SITE_OTHER): Payer: Medicaid Other | Admitting: Family Medicine

## 2014-05-18 VITALS — BP 112/73 | HR 89 | Wt 179.0 lb

## 2014-05-18 DIAGNOSIS — O26893 Other specified pregnancy related conditions, third trimester: Secondary | ICD-10-CM

## 2014-05-18 DIAGNOSIS — N898 Other specified noninflammatory disorders of vagina: Secondary | ICD-10-CM | POA: Diagnosis not present

## 2014-05-18 DIAGNOSIS — Z3403 Encounter for supervision of normal first pregnancy, third trimester: Secondary | ICD-10-CM

## 2014-05-18 NOTE — Progress Notes (Signed)
Reports vaginal itching. Vaginal area is exquisitely tender. Cervix is closed. Check wet prep

## 2014-05-18 NOTE — Patient Instructions (Addendum)
Contraception Choices Contraception (birth control) is the use of any methods or devices to prevent pregnancy. Below are some methods to help avoid pregnancy. HORMONAL METHODS   Contraceptive implant. This is a thin, plastic tube containing progesterone hormone. It does not contain estrogen hormone. Your health care provider inserts the tube in the inner part of the upper arm. The tube can remain in place for up to 3 years. After 3 years, the implant must be removed. The implant prevents the ovaries from releasing an egg (ovulation), thickens the cervical mucus to prevent sperm from entering the uterus, and thins the lining of the inside of the uterus.  Progesterone-only injections. These injections are given every 3 months by your health care provider to prevent pregnancy. This synthetic progesterone hormone stops the ovaries from releasing eggs. It also thickens cervical mucus and changes the uterine lining. This makes it harder for sperm to survive in the uterus.  Birth control pills. These pills contain estrogen and progesterone hormone. They work by preventing the ovaries from releasing eggs (ovulation). They also cause the cervical mucus to thicken, preventing the sperm from entering the uterus. Birth control pills are prescribed by a health care provider.Birth control pills can also be used to treat heavy periods.  Minipill. This type of birth control pill contains only the progesterone hormone. They are taken every day of each month and must be prescribed by your health care provider.  Birth control patch. The patch contains hormones similar to those in birth control pills. It must be changed once a week and is prescribed by a health care provider.  Vaginal ring. The ring contains hormones similar to those in birth control pills. It is left in the vagina for 3 weeks, removed for 1 week, and then a new one is put back in place. The patient must be comfortable inserting and removing the ring  from the vagina.A health care provider's prescription is necessary.  Emergency contraception. Emergency contraceptives prevent pregnancy after unprotected sexual intercourse. This pill can be taken right after sex or up to 5 days after unprotected sex. It is most effective the sooner you take the pills after having sexual intercourse. Most emergency contraceptive pills are available without a prescription. Check with your pharmacist. Do not use emergency contraception as your only form of birth control. BARRIER METHODS   Female condom. This is a thin sheath (latex or rubber) that is worn over the penis during sexual intercourse. It can be used with spermicide to increase effectiveness.  Female condom. This is a soft, loose-fitting sheath that is put into the vagina before sexual intercourse.  Diaphragm. This is a soft, latex, dome-shaped barrier that must be fitted by a health care provider. It is inserted into the vagina, along with a spermicidal jelly. It is inserted before intercourse. The diaphragm should be left in the vagina for 6 to 8 hours after intercourse.  Cervical cap. This is a round, soft, latex or plastic cup that fits over the cervix and must be fitted by a health care provider. The cap can be left in place for up to 48 hours after intercourse.  Sponge. This is a soft, circular piece of polyurethane foam. The sponge has spermicide in it. It is inserted into the vagina after wetting it and before sexual intercourse.  Spermicides. These are chemicals that kill or block sperm from entering the cervix and uterus. They come in the form of creams, jellies, suppositories, foam, or tablets. They do not require a   prescription. They are inserted into the vagina with an applicator before having sexual intercourse. The process must be repeated every time you have sexual intercourse. INTRAUTERINE CONTRACEPTION  Intrauterine device (IUD). This is a T-shaped device that is put in a woman's uterus  during a menstrual period to prevent pregnancy. There are 2 types:  Copper IUD. This type of IUD is wrapped in copper wire and is placed inside the uterus. Copper makes the uterus and fallopian tubes produce a fluid that kills sperm. It can stay in place for 10 years.  Hormone IUD. This type of IUD contains the hormone progestin (synthetic progesterone). The hormone thickens the cervical mucus and prevents sperm from entering the uterus, and it also thins the uterine lining to prevent implantation of a fertilized egg. The hormone can weaken or kill the sperm that get into the uterus. It can stay in place for 3-5 years, depending on which type of IUD is used. PERMANENT METHODS OF CONTRACEPTION  Female tubal ligation. This is when the woman's fallopian tubes are surgically sealed, tied, or blocked to prevent the egg from traveling to the uterus.  Hysteroscopic sterilization. This involves placing a small coil or insert into each fallopian tube. Your doctor uses a technique called hysteroscopy to do the procedure. The device causes scar tissue to form. This results in permanent blockage of the fallopian tubes, so the sperm cannot fertilize the egg. It takes about 3 months after the procedure for the tubes to become blocked. You must use another form of birth control for these 3 months.  Female sterilization. This is when the female has the tubes that carry sperm tied off (vasectomy).This blocks sperm from entering the vagina during sexual intercourse. After the procedure, the man can still ejaculate fluid (semen). NATURAL PLANNING METHODS  Natural family planning. This is not having sexual intercourse or using a barrier method (condom, diaphragm, cervical cap) on days the woman could become pregnant.  Calendar method. This is keeping track of the length of each menstrual cycle and identifying when you are fertile.  Ovulation method. This is avoiding sexual intercourse during ovulation.  Symptothermal  method. This is avoiding sexual intercourse during ovulation, using a thermometer and ovulation symptoms.  Post-ovulation method. This is timing sexual intercourse after you have ovulated. Regardless of which type or method of contraception you choose, it is important that you use condoms to protect against the transmission of sexually transmitted infections (STIs). Talk with your health care provider about which form of contraception is most appropriate for you. Document Released: 01/02/2005 Document Revised: 01/07/2013 Document Reviewed: 06/27/2012 [employer] Patient Information 2015 [employer]. This information is not intended to replace advice given to you by your health care provider. Make sure you discuss any questions you have with your health care provider.  Breastfeeding Deciding to breastfeed is one of the best choices you can make for you and your baby. A change in hormones during pregnancy causes your breast tissue to grow and increases the number and size of your milk ducts. These hormones also allow proteins, sugars, and fats from your blood supply to make breast milk in your milk-producing glands. Hormones prevent breast milk from being released before your baby is born as well as prompt milk flow after birth. Once breastfeeding has begun, thoughts of your baby, as well as his or her sucking or crying, can stimulate the release of milk from your milk-producing glands.  BENEFITS OF BREASTFEEDING For Your Baby  Your first milk (colostrum) helps   your baby's digestive system function better.   There are antibodies in your milk that help your baby fight off infections.   Your baby has a lower incidence of asthma, allergies, and sudden infant death syndrome.   The nutrients in breast milk are better for your baby than infant formulas and are designed uniquely for your baby's needs.   Breast milk improves your baby's brain development.   Your baby is less likely to develop other  conditions, such as childhood obesity, asthma, or type 2 diabetes mellitus.  For You   Breastfeeding helps to create a very special bond between you and your baby.   Breastfeeding is convenient. Breast milk is always available at the correct temperature and costs nothing.   Breastfeeding helps to burn calories and helps you lose the weight gained during pregnancy.   Breastfeeding makes your uterus contract to its prepregnancy size faster and slows bleeding (lochia) after you give birth.   Breastfeeding helps to lower your risk of developing type 2 diabetes mellitus, osteoporosis, and breast or ovarian cancer later in life. SIGNS THAT YOUR BABY IS HUNGRY Early Signs of Hunger  Increased alertness or activity.  Stretching.  Movement of the head from side to side.  Movement of the head and opening of the mouth when the corner of the mouth or cheek is stroked (rooting).  Increased sucking sounds, smacking lips, cooing, sighing, or squeaking.  Hand-to-mouth movements.  Increased sucking of fingers or hands. Late Signs of Hunger  Fussing.  Intermittent crying. Extreme Signs of Hunger Signs of extreme hunger will require calming and consoling before your baby will be able to breastfeed successfully. Do not wait for the following signs of extreme hunger to occur before you initiate breastfeeding:   Restlessness.  A loud, strong cry.   Screaming. BREASTFEEDING BASICS Breastfeeding Initiation  Find a comfortable place to sit or lie down, with your neck and back well supported.  Place a pillow or rolled up blanket under your baby to bring him or her to the level of your breast (if you are seated). Nursing pillows are specially designed to help support your arms and your baby while you breastfeed.  Make sure that your baby's abdomen is facing your abdomen.   Gently massage your breast. With your fingertips, massage from your chest wall toward your nipple in a circular  motion. This encourages milk flow. You may need to continue this action during the feeding if your milk flows slowly.  Support your breast with 4 fingers underneath and your thumb above your nipple. Make sure your fingers are well away from your nipple and your baby's mouth.   Stroke your baby's lips gently with your finger or nipple.   When your baby's mouth is open wide enough, quickly bring your baby to your breast, placing your entire nipple and as much of the colored area around your nipple (areola) as possible into your baby's mouth.   More areola should be visible above your baby's upper lip than below the lower lip.   Your baby's tongue should be between his or her lower gum and your breast.   Ensure that your baby's mouth is correctly positioned around your nipple (latched). Your baby's lips should create a seal on your breast and be turned out (everted).  It is common for your baby to suck about 2-3 minutes in order to start the flow of breast milk. Latching Teaching your baby how to latch on to your breast properly is very   important. An improper latch can cause nipple pain and decreased milk supply for you and poor weight gain in your baby. Also, if your baby is not latched onto your nipple properly, he or she may swallow some air during feeding. This can make your baby fussy. Burping your baby when you switch breasts during the feeding can help to get rid of the air. However, teaching your baby to latch on properly is still the best way to prevent fussiness from swallowing air while breastfeeding. Signs that your baby has successfully latched on to your nipple:    Silent tugging or silent sucking, without causing you pain.   Swallowing heard between every 3-4 sucks.    Muscle movement above and in front of his or her ears while sucking.  Signs that your baby has not successfully latched on to nipple:   Sucking sounds or smacking sounds from your baby while  breastfeeding.  Nipple pain. If you think your baby has not latched on correctly, slip your finger into the corner of your baby's mouth to break the suction and place it between your baby's gums. Attempt breastfeeding initiation again. Signs of Successful Breastfeeding Signs from your baby:   A gradual decrease in the number of sucks or complete cessation of sucking.   Falling asleep.   Relaxation of his or her body.   Retention of a small amount of milk in his or her mouth.   Letting go of your breast by himself or herself. Signs from you:  Breasts that have increased in firmness, weight, and size 1-3 hours after feeding.   Breasts that are softer immediately after breastfeeding.  Increased milk volume, as well as a change in milk consistency and color by the fifth day of breastfeeding.   Nipples that are not sore, cracked, or bleeding. Signs That Your Baby is Getting Enough Milk  Wetting at least 3 diapers in a 24-hour period. The urine should be clear and pale yellow by age 5 days.  At least 3 stools in a 24-hour period by age 5 days. The stool should be soft and yellow.  At least 3 stools in a 24-hour period by age 7 days. The stool should be seedy and yellow.  No loss of weight greater than 10% of birth weight during the first 3 days of age.  Average weight gain of 4-7 ounces (113-198 g) per week after age 4 days.  Consistent daily weight gain by age 5 days, without weight loss after the age of 2 weeks. After a feeding, your baby may spit up a small amount. This is common. BREASTFEEDING FREQUENCY AND DURATION Frequent feeding will help you make more milk and can prevent sore nipples and breast engorgement. Breastfeed when you feel the need to reduce the fullness of your breasts or when your baby shows signs of hunger. This is called "breastfeeding on demand." Avoid introducing a pacifier to your baby while you are working to establish breastfeeding (the first 4-6  weeks after your baby is born). After this time you may choose to use a pacifier. Research has shown that pacifier use during the first year of a baby's life decreases the risk of sudden infant death syndrome (SIDS). Allow your baby to feed on each breast as long as he or she wants. Breastfeed until your baby is finished feeding. When your baby unlatches or falls asleep while feeding from the first breast, offer the second breast. Because newborns are often sleepy in the first few weeks   of life, you may need to awaken your baby to get him or her to feed. Breastfeeding times will vary from baby to baby. However, the following rules can serve as a guide to help you ensure that your baby is properly fed:  Newborns (babies 4 weeks of age or younger) may breastfeed every 1-3 hours.  Newborns should not go longer than 3 hours during the day or 5 hours during the night without breastfeeding.  You should breastfeed your baby a minimum of 8 times in a 24-hour period until you begin to introduce solid foods to your baby at around 6 months of age. BREAST MILK PUMPING Pumping and storing breast milk allows you to ensure that your baby is exclusively fed your breast milk, even at times when you are unable to breastfeed. This is especially important if you are going back to work while you are still breastfeeding or when you are not able to be present during feedings. Your lactation consultant can give you guidelines on how long it is safe to store breast milk.  A breast pump is a machine that allows you to pump milk from your breast into a sterile bottle. The pumped breast milk can then be stored in a refrigerator or freezer. Some breast pumps are operated by hand, while others use electricity. Ask your lactation consultant which type will work best for you. Breast pumps can be purchased, but some hospitals and breastfeeding support groups lease breast pumps on a monthly basis. A lactation consultant can teach you how  to hand express breast milk, if you prefer not to use a pump.  CARING FOR YOUR BREASTS WHILE YOU BREASTFEED Nipples can become dry, cracked, and sore while breastfeeding. The following recommendations can help keep your breasts moisturized and healthy:  Avoid using soap on your nipples.   Wear a supportive bra. Although not required, special nursing bras and tank tops are designed to allow access to your breasts for breastfeeding without taking off your entire bra or top. Avoid wearing underwire-style bras or extremely tight bras.  Air dry your nipples for 3-4minutes after each feeding.   Use only cotton bra pads to absorb leaked breast milk. Leaking of breast milk between feedings is normal.   Use lanolin on your nipples after breastfeeding. Lanolin helps to maintain your skin's normal moisture barrier. If you use pure lanolin, you do not need to wash it off before feeding your baby again. Pure lanolin is not toxic to your baby. You may also hand express a few drops of breast milk and gently massage that milk into your nipples and allow the milk to air dry. In the first few weeks after giving birth, some women experience extremely full breasts (engorgement). Engorgement can make your breasts feel heavy, warm, and tender to the touch. Engorgement peaks within 3-5 days after you give birth. The following recommendations can help ease engorgement:  Completely empty your breasts while breastfeeding or pumping. You may want to start by applying warm, moist heat (in the shower or with warm water-soaked hand towels) just before feeding or pumping. This increases circulation and helps the milk flow. If your baby does not completely empty your breasts while breastfeeding, pump any extra milk after he or she is finished.  Wear a snug bra (nursing or regular) or tank top for 1-2 days to signal your body to slightly decrease milk production.  Apply ice packs to your breasts, unless this is too  uncomfortable for you.  Make sure   that your baby is latched on and positioned properly while breastfeeding. If engorgement persists after 48 hours of following these recommendations, contact your health care provider or a Advertising copywriter. OVERALL HEALTH CARE RECOMMENDATIONS WHILE BREASTFEEDING  Eat healthy foods. Alternate between meals and snacks, eating 3 of each per day. Because what you eat affects your breast milk, some of the foods may make your baby more irritable than usual. Avoid eating these foods if you are sure that they are negatively affecting your baby.  Drink milk, fruit juice, and water to satisfy your thirst (about 10 glasses a day).   Rest often, relax, and continue to take your prenatal vitamins to prevent fatigue, stress, and anemia.  Continue breast self-awareness checks.  Avoid chewing and smoking tobacco.  Avoid alcohol and drug use. Some medicines that may be harmful to your baby can pass through breast milk. It is important to ask your health care provider before taking any medicine, including all over-the-counter and prescription medicine as well as vitamin and herbal supplements. It is possible to become pregnant while breastfeeding. If birth control is desired, ask your health care provider about options that will be safe for your baby. SEEK MEDICAL CARE IF:   You feel like you want to stop breastfeeding or have become frustrated with breastfeeding.  You have painful breasts or nipples.  Your nipples are cracked or bleeding.  Your breasts are red, tender, or warm.  You have a swollen area on either breast.  You have a fever or chills.  You have nausea or vomiting.  You have drainage other than breast milk from your nipples.  Your breasts do not become full before feedings by the fifth day after you give birth.  You feel sad and depressed.  Your baby is too sleepy to eat well.  Your baby is having trouble sleeping.   Your baby is wetting  less than 3 diapers in a 24-hour period.  Your baby has less than 3 stools in a 24-hour period.  Your baby's skin or the white part of his or her eyes becomes yellow.   Your baby is not gaining weight by 17 days of age. SEEK IMMEDIATE MEDICAL CARE IF:   Your baby is overly tired (lethargic) and does not want to wake up and feed.  Your baby develops an unexplained fever. Document Released: 01/02/2005 Document Revised: 01/07/2013 Document Reviewed: 06/26/2012 [facility] Patient Information 2015 [place], [place]. This information is not intended to replace advice given to you by your health care provider. Make sure you discuss any questions you have with your health care provider. Hidradenitis Suppurativa, Sweat Gland Abscess Hidradenitis suppurativa is a long lasting (chronic), uncommon disease of the sweat glands. With this, boil-like lumps and scarring develop in the groin, some times under the arms (axillae), and under the breasts. It may also uncommonly occur behind the ears, in the crease of the buttocks, and around the genitals.  CAUSES  The cause is from a blocking of the sweat glands. They then become infected. It may cause drainage and odor. It is not contagious. So it cannot be given to someone else. It most often shows up in puberty (about 43 to 25 years of age). But it may happen much later. It is similar to acne which is a disease of the sweat glands. This condition is slightly more common in African-Americans and women. SYMPTOMS   Hidradenitis usually starts as one or more red, tender, swellings in the groin or under the arms (  axilla).  Over a period of hours to days the lesions get larger. They often open to the skin surface, draining clear to yellow-colored fluid.  The infected area heals with scarring. DIAGNOSIS  Your caregiver makes this diagnosis by looking at you. Sometimes cultures (growing germs on plates in the lab) may be taken. This is to see what germ (bacterium) is  causing the infection.  TREATMENT   Topical germ killing medicine applied to the skin (antibiotics) are the treatment of choice. Antibiotics taken by mouth (systemic) are sometimes needed when the condition is getting worse or is severe.  Avoid tight-fitting clothing which traps moisture in.  Dirt does not cause hidradenitis and it is not caused by poor hygiene.  Involved areas should be cleaned daily using an antibacterial soap. Some patients find that the liquid form of Lever 2000, applied to the involved areas as a lotion after bathing, can help reduce the odor related to this condition.  Sometimes surgery is needed to drain infected areas or remove scarred tissue. Removal of large amounts of tissue is used only in severe cases.  Birth control pills may be helpful.  Oral retinoids (vitamin A derivatives) for 6 to 12 months which are effective for acne may also help this condition.  Weight loss will improve but not cure hidradenitis. It is made worse by being overweight. But the condition is not caused by being overweight.  This condition is more common in people who have had acne.  It may become worse under stress. There is no medical cure for hidradenitis. It can be controlled, but not cured. The condition usually continues for years with periods of getting worse and getting better (remission). Document Released: 08/17/2003 Document Revised: 03/27/2011 Document Reviewed: 04/04/2013 [facility] Patient Information 2015 [place]. This information is not intended to replace advice given to you by your health care provider. Make sure you discuss any questions you have with your health care provider.

## 2014-05-18 NOTE — Progress Notes (Signed)
Pt is complaining of vaginal itching today, no discharge.

## 2014-05-19 LAB — WET PREP, GENITAL
Clue Cells Wet Prep HPF POC: NONE SEEN
Trich, Wet Prep: NONE SEEN
Yeast Wet Prep HPF POC: NONE SEEN

## 2014-05-25 ENCOUNTER — Encounter: Payer: Medicaid Other | Admitting: Family Medicine

## 2014-05-26 ENCOUNTER — Encounter: Payer: Medicaid Other | Admitting: Family Medicine

## 2014-05-27 ENCOUNTER — Institutional Professional Consult (permissible substitution): Payer: Self-pay | Admitting: Pediatrics

## 2014-06-02 ENCOUNTER — Ambulatory Visit (INDEPENDENT_AMBULATORY_CARE_PROVIDER_SITE_OTHER): Payer: Medicaid Other | Admitting: Obstetrics & Gynecology

## 2014-06-02 VITALS — BP 115/71 | HR 60 | Wt 185.0 lb

## 2014-06-02 DIAGNOSIS — Z3403 Encounter for supervision of normal first pregnancy, third trimester: Secondary | ICD-10-CM

## 2014-06-02 NOTE — Progress Notes (Signed)
Pelvic cultures next visit. No other complaints or concerns.  Labor and fetal movement precautions reviewed.

## 2014-06-02 NOTE — Patient Instructions (Signed)
Return to clinic for any obstetric concerns or go to MAU for evaluation  

## 2014-06-04 ENCOUNTER — Ambulatory Visit (INDEPENDENT_AMBULATORY_CARE_PROVIDER_SITE_OTHER): Payer: Self-pay | Admitting: Pediatrics

## 2014-06-04 DIAGNOSIS — Z3490 Encounter for supervision of normal pregnancy, unspecified, unspecified trimester: Secondary | ICD-10-CM

## 2014-06-04 DIAGNOSIS — Z7681 Expectant parent(s) prebirth pediatrician visit: Secondary | ICD-10-CM

## 2014-06-04 NOTE — Progress Notes (Signed)
Prenatal counseling for impending newborn done-- Z76.81  

## 2014-06-09 ENCOUNTER — Ambulatory Visit (INDEPENDENT_AMBULATORY_CARE_PROVIDER_SITE_OTHER): Payer: Medicaid Other | Admitting: Family Medicine

## 2014-06-09 ENCOUNTER — Encounter: Payer: Self-pay | Admitting: Family Medicine

## 2014-06-09 VITALS — BP 124/78 | HR 84 | Wt 187.8 lb

## 2014-06-09 DIAGNOSIS — Z36 Encounter for antenatal screening of mother: Secondary | ICD-10-CM | POA: Diagnosis not present

## 2014-06-09 DIAGNOSIS — Z3403 Encounter for supervision of normal first pregnancy, third trimester: Secondary | ICD-10-CM

## 2014-06-09 LAB — OB RESULTS CONSOLE GBS: STREP GROUP B AG: POSITIVE

## 2014-06-09 NOTE — Progress Notes (Signed)
Subjective:   Autumn Crawford Seeley is a 24 y.o. G3P0020 40110w0d being seen today for her obstetrical visit.  Patient reports no complaints.  Denies contractions, vaginal bleeding or leaking of fluid.  Reports good fetal movement.  The following portions of the patient's history were reviewed and updated as appropriate: allergies, current medications, past family history, past medical history, past social history, past surgical history and problem list.   Objective:  BP 124/78 mmHg  Pulse 84  Wt 187 lb 12.8 oz (85.186 kg)  LMP 09/30/2013  FHT: Fetal Heart Rate (bpm): 154  Uterine Size: Fundal Height: 35 cm  Fetal Movement: Movement: Present  Presentation: Presentation: Vertex    Abdomen:  soft, gravid, appropriate for gestational age,non-tender   Urine prot neg, gluc neg  Assessment and Plan:   Pregnancy:  G3P0020 at 74110w0d  1. Encounter for supervision of normal first pregnancy in third trimester Doing well Cultures today - Culture, beta strep (group b only) - GC/Chlamydia Probe Amp  Preterm labor symptoms: vaginal bleeding, contractions and leaking of fluid reviewed in detail.  Fetal movement precautions reviewed.  Follow up in 1 week.  Reva Boresanya S Tanara Turvey, MD

## 2014-06-09 NOTE — Patient Instructions (Signed)
Contraception Choices Contraception (birth control) is the use of any methods or devices to prevent pregnancy. Below are some methods to help avoid pregnancy. HORMONAL METHODS   Contraceptive implant. This is a thin, plastic tube containing progesterone hormone. It does not contain estrogen hormone. Your health care provider inserts the tube in the inner part of the upper arm. The tube can remain in place for up to 3 years. After 3 years, the implant must be removed. The implant prevents the ovaries from releasing an egg (ovulation), thickens the cervical mucus to prevent sperm from entering the uterus, and thins the lining of the inside of the uterus.  Progesterone-only injections. These injections are given every 3 months by your health care provider to prevent pregnancy. This synthetic progesterone hormone stops the ovaries from releasing eggs. It also thickens cervical mucus and changes the uterine lining. This makes it harder for sperm to survive in the uterus.  Birth control pills. These pills contain estrogen and progesterone hormone. They work by preventing the ovaries from releasing eggs (ovulation). They also cause the cervical mucus to thicken, preventing the sperm from entering the uterus. Birth control pills are prescribed by a health care provider.Birth control pills can also be used to treat heavy periods.  Minipill. This type of birth control pill contains only the progesterone hormone. They are taken every day of each month and must be prescribed by your health care provider.  Birth control patch. The patch contains hormones similar to those in birth control pills. It must be changed once a week and is prescribed by a health care provider.  Vaginal ring. The ring contains hormones similar to those in birth control pills. It is left in the vagina for 3 weeks, removed for 1 week, and then a new one is put back in place. The patient must be comfortable inserting and removing the ring  from the vagina.A health care provider's prescription is necessary.  Emergency contraception. Emergency contraceptives prevent pregnancy after unprotected sexual intercourse. This pill can be taken right after sex or up to 5 days after unprotected sex. It is most effective the sooner you take the pills after having sexual intercourse. Most emergency contraceptive pills are available without a prescription. Check with your pharmacist. Do not use emergency contraception as your only form of birth control. BARRIER METHODS   Female condom. This is a thin sheath (latex or rubber) that is worn over the penis during sexual intercourse. It can be used with spermicide to increase effectiveness.  Female condom. This is a soft, loose-fitting sheath that is put into the vagina before sexual intercourse.  Diaphragm. This is a soft, latex, dome-shaped barrier that must be fitted by a health care provider. It is inserted into the vagina, along with a spermicidal jelly. It is inserted before intercourse. The diaphragm should be left in the vagina for 6 to 8 hours after intercourse.  Cervical cap. This is a round, soft, latex or plastic cup that fits over the cervix and must be fitted by a health care provider. The cap can be left in place for up to 48 hours after intercourse.  Sponge. This is a soft, circular piece of polyurethane foam. The sponge has spermicide in it. It is inserted into the vagina after wetting it and before sexual intercourse.  Spermicides. These are chemicals that kill or block sperm from entering the cervix and uterus. They come in the form of creams, jellies, suppositories, foam, or tablets. They do not require a   prescription. They are inserted into the vagina with an applicator before having sexual intercourse. The process must be repeated every time you have sexual intercourse. INTRAUTERINE CONTRACEPTION  Intrauterine device (IUD). This is a T-shaped device that is put in a woman's uterus  during a menstrual period to prevent pregnancy. There are 2 types:  Copper IUD. This type of IUD is wrapped in copper wire and is placed inside the uterus. Copper makes the uterus and fallopian tubes produce a fluid that kills sperm. It can stay in place for 10 years.  Hormone IUD. This type of IUD contains the hormone progestin (synthetic progesterone). The hormone thickens the cervical mucus and prevents sperm from entering the uterus, and it also thins the uterine lining to prevent implantation of a fertilized egg. The hormone can weaken or kill the sperm that get into the uterus. It can stay in place for 3-5 years, depending on which type of IUD is used. PERMANENT METHODS OF CONTRACEPTION  Female tubal ligation. This is when the woman's fallopian tubes are surgically sealed, tied, or blocked to prevent the egg from traveling to the uterus.  Hysteroscopic sterilization. This involves placing a small coil or insert into each fallopian tube. Your doctor uses a technique called hysteroscopy to do the procedure. The device causes scar tissue to form. This results in permanent blockage of the fallopian tubes, so the sperm cannot fertilize the egg. It takes about 3 months after the procedure for the tubes to become blocked. You must use another form of birth control for these 3 months.  Female sterilization. This is when the female has the tubes that carry sperm tied off (vasectomy).This blocks sperm from entering the vagina during sexual intercourse. After the procedure, the man can still ejaculate fluid (semen). NATURAL PLANNING METHODS  Natural family planning. This is not having sexual intercourse or using a barrier method (condom, diaphragm, cervical cap) on days the woman could become pregnant.  Calendar method. This is keeping track of the length of each menstrual cycle and identifying when you are fertile.  Ovulation method. This is avoiding sexual intercourse during ovulation.  Symptothermal  method. This is avoiding sexual intercourse during ovulation, using a thermometer and ovulation symptoms.  Post-ovulation method. This is timing sexual intercourse after you have ovulated. Regardless of which type or method of contraception you choose, it is important that you use condoms to protect against the transmission of sexually transmitted infections (STIs). Talk with your health care provider about which form of contraception is most appropriate for you. Document Released: 01/02/2005 Document Revised: 01/07/2013 Document Reviewed: 06/27/2012 [employer] Patient Information 2015 [employer]. This information is not intended to replace advice given to you by your health care provider. Make sure you discuss any questions you have with your health care provider.  Breastfeeding Deciding to breastfeed is one of the best choices you can make for you and your baby. A change in hormones during pregnancy causes your breast tissue to grow and increases the number and size of your milk ducts. These hormones also allow proteins, sugars, and fats from your blood supply to make breast milk in your milk-producing glands. Hormones prevent breast milk from being released before your baby is born as well as prompt milk flow after birth. Once breastfeeding has begun, thoughts of your baby, as well as his or her sucking or crying, can stimulate the release of milk from your milk-producing glands.  BENEFITS OF BREASTFEEDING For Your Baby  Your first milk (colostrum) helps   your baby's digestive system function better.   There are antibodies in your milk that help your baby fight off infections.   Your baby has a lower incidence of asthma, allergies, and sudden infant death syndrome.   The nutrients in breast milk are better for your baby than infant formulas and are designed uniquely for your baby's needs.   Breast milk improves your baby's brain development.   Your baby is less likely to develop other  conditions, such as childhood obesity, asthma, or type 2 diabetes mellitus.  For You   Breastfeeding helps to create a very special bond between you and your baby.   Breastfeeding is convenient. Breast milk is always available at the correct temperature and costs nothing.   Breastfeeding helps to burn calories and helps you lose the weight gained during pregnancy.   Breastfeeding makes your uterus contract to its prepregnancy size faster and slows bleeding (lochia) after you give birth.   Breastfeeding helps to lower your risk of developing type 2 diabetes mellitus, osteoporosis, and breast or ovarian cancer later in life. SIGNS THAT YOUR BABY IS HUNGRY Early Signs of Hunger  Increased alertness or activity.  Stretching.  Movement of the head from side to side.  Movement of the head and opening of the mouth when the corner of the mouth or cheek is stroked (rooting).  Increased sucking sounds, smacking lips, cooing, sighing, or squeaking.  Hand-to-mouth movements.  Increased sucking of fingers or hands. Late Signs of Hunger  Fussing.  Intermittent crying. Extreme Signs of Hunger Signs of extreme hunger will require calming and consoling before your baby will be able to breastfeed successfully. Do not wait for the following signs of extreme hunger to occur before you initiate breastfeeding:   Restlessness.  A loud, strong cry.   Screaming. BREASTFEEDING BASICS Breastfeeding Initiation  Find a comfortable place to sit or lie down, with your neck and back well supported.  Place a pillow or rolled up blanket under your baby to bring him or her to the level of your breast (if you are seated). Nursing pillows are specially designed to help support your arms and your baby while you breastfeed.  Make sure that your baby's abdomen is facing your abdomen.   Gently massage your breast. With your fingertips, massage from your chest wall toward your nipple in a circular  motion. This encourages milk flow. You may need to continue this action during the feeding if your milk flows slowly.  Support your breast with 4 fingers underneath and your thumb above your nipple. Make sure your fingers are well away from your nipple and your baby's mouth.   Stroke your baby's lips gently with your finger or nipple.   When your baby's mouth is open wide enough, quickly bring your baby to your breast, placing your entire nipple and as much of the colored area around your nipple (areola) as possible into your baby's mouth.   More areola should be visible above your baby's upper lip than below the lower lip.   Your baby's tongue should be between his or her lower gum and your breast.   Ensure that your baby's mouth is correctly positioned around your nipple (latched). Your baby's lips should create a seal on your breast and be turned out (everted).  It is common for your baby to suck about 2-3 minutes in order to start the flow of breast milk. Latching Teaching your baby how to latch on to your breast properly is very   important. An improper latch can cause nipple pain and decreased milk supply for you and poor weight gain in your baby. Also, if your baby is not latched onto your nipple properly, he or she may swallow some air during feeding. This can make your baby fussy. Burping your baby when you switch breasts during the feeding can help to get rid of the air. However, teaching your baby to latch on properly is still the best way to prevent fussiness from swallowing air while breastfeeding. Signs that your baby has successfully latched on to your nipple:    Silent tugging or silent sucking, without causing you pain.   Swallowing heard between every 3-4 sucks.    Muscle movement above and in front of his or her ears while sucking.  Signs that your baby has not successfully latched on to nipple:   Sucking sounds or smacking sounds from your baby while  breastfeeding.  Nipple pain. If you think your baby has not latched on correctly, slip your finger into the corner of your baby's mouth to break the suction and place it between your baby's gums. Attempt breastfeeding initiation again. Signs of Successful Breastfeeding Signs from your baby:   A gradual decrease in the number of sucks or complete cessation of sucking.   Falling asleep.   Relaxation of his or her body.   Retention of a small amount of milk in his or her mouth.   Letting go of your breast by himself or herself. Signs from you:  Breasts that have increased in firmness, weight, and size 1-3 hours after feeding.   Breasts that are softer immediately after breastfeeding.  Increased milk volume, as well as a change in milk consistency and color by the fifth day of breastfeeding.   Nipples that are not sore, cracked, or bleeding. Signs That Your Baby is Getting Enough Milk  Wetting at least 3 diapers in a 24-hour period. The urine should be clear and pale yellow by age 5 days.  At least 3 stools in a 24-hour period by age 5 days. The stool should be soft and yellow.  At least 3 stools in a 24-hour period by age 7 days. The stool should be seedy and yellow.  No loss of weight greater than 10% of birth weight during the first 3 days of age.  Average weight gain of 4-7 ounces (113-198 g) per week after age 4 days.  Consistent daily weight gain by age 5 days, without weight loss after the age of 2 weeks. After a feeding, your baby may spit up a small amount. This is common. BREASTFEEDING FREQUENCY AND DURATION Frequent feeding will help you make more milk and can prevent sore nipples and breast engorgement. Breastfeed when you feel the need to reduce the fullness of your breasts or when your baby shows signs of hunger. This is called "breastfeeding on demand." Avoid introducing a pacifier to your baby while you are working to establish breastfeeding (the first 4-6  weeks after your baby is born). After this time you may choose to use a pacifier. Research has shown that pacifier use during the first year of a baby's life decreases the risk of sudden infant death syndrome (SIDS). Allow your baby to feed on each breast as long as he or she wants. Breastfeed until your baby is finished feeding. When your baby unlatches or falls asleep while feeding from the first breast, offer the second breast. Because newborns are often sleepy in the first few weeks   of life, you may need to awaken your baby to get him or her to feed. Breastfeeding times will vary from baby to baby. However, the following rules can serve as a guide to help you ensure that your baby is properly fed:  Newborns (babies 4 weeks of age or younger) may breastfeed every 1-3 hours.  Newborns should not go longer than 3 hours during the day or 5 hours during the night without breastfeeding.  You should breastfeed your baby a minimum of 8 times in a 24-hour period until you begin to introduce solid foods to your baby at around 6 months of age. BREAST MILK PUMPING Pumping and storing breast milk allows you to ensure that your baby is exclusively fed your breast milk, even at times when you are unable to breastfeed. This is especially important if you are going back to work while you are still breastfeeding or when you are not able to be present during feedings. Your lactation consultant can give you guidelines on how long it is safe to store breast milk.  A breast pump is a machine that allows you to pump milk from your breast into a sterile bottle. The pumped breast milk can then be stored in a refrigerator or freezer. Some breast pumps are operated by hand, while others use electricity. Ask your lactation consultant which type will work best for you. Breast pumps can be purchased, but some hospitals and breastfeeding support groups lease breast pumps on a monthly basis. A lactation consultant can teach you how  to hand express breast milk, if you prefer not to use a pump.  CARING FOR YOUR BREASTS WHILE YOU BREASTFEED Nipples can become dry, cracked, and sore while breastfeeding. The following recommendations can help keep your breasts moisturized and healthy:  Avoid using soap on your nipples.   Wear a supportive bra. Although not required, special nursing bras and tank tops are designed to allow access to your breasts for breastfeeding without taking off your entire bra or top. Avoid wearing underwire-style bras or extremely tight bras.  Air dry your nipples for 3-4minutes after each feeding.   Use only cotton bra pads to absorb leaked breast milk. Leaking of breast milk between feedings is normal.   Use lanolin on your nipples after breastfeeding. Lanolin helps to maintain your skin's normal moisture barrier. If you use pure lanolin, you do not need to wash it off before feeding your baby again. Pure lanolin is not toxic to your baby. You may also hand express a few drops of breast milk and gently massage that milk into your nipples and allow the milk to air dry. In the first few weeks after giving birth, some women experience extremely full breasts (engorgement). Engorgement can make your breasts feel heavy, warm, and tender to the touch. Engorgement peaks within 3-5 days after you give birth. The following recommendations can help ease engorgement:  Completely empty your breasts while breastfeeding or pumping. You may want to start by applying warm, moist heat (in the shower or with warm water-soaked hand towels) just before feeding or pumping. This increases circulation and helps the milk flow. If your baby does not completely empty your breasts while breastfeeding, pump any extra milk after he or she is finished.  Wear a snug bra (nursing or regular) or tank top for 1-2 days to signal your body to slightly decrease milk production.  Apply ice packs to your breasts, unless this is too  uncomfortable for you.  Make sure   that your baby is latched on and positioned properly while breastfeeding. If engorgement persists after 48 hours of following these recommendations, contact your health care provider or a lactation consultant. OVERALL HEALTH CARE RECOMMENDATIONS WHILE BREASTFEEDING  Eat healthy foods. Alternate between meals and snacks, eating 3 of each per day. Because what you eat affects your breast milk, some of the foods may make your baby more irritable than usual. Avoid eating these foods if you are sure that they are negatively affecting your baby.  Drink milk, fruit juice, and water to satisfy your thirst (about 10 glasses a day).   Rest often, relax, and continue to take your prenatal vitamins to prevent fatigue, stress, and anemia.  Continue breast self-awareness checks.  Avoid chewing and smoking tobacco.  Avoid alcohol and drug use. Some medicines that may be harmful to your baby can pass through breast milk. It is important to ask your health care provider before taking any medicine, including all over-the-counter and prescription medicine as well as vitamin and herbal supplements. It is possible to become pregnant while breastfeeding. If birth control is desired, ask your health care provider about options that will be safe for your baby. SEEK MEDICAL CARE IF:   You feel like you want to stop breastfeeding or have become frustrated with breastfeeding.  You have painful breasts or nipples.  Your nipples are cracked or bleeding.  Your breasts are red, tender, or warm.  You have a swollen area on either breast.  You have a fever or chills.  You have nausea or vomiting.  You have drainage other than breast milk from your nipples.  Your breasts do not become full before feedings by the fifth day after you give birth.  You feel sad and depressed.  Your baby is too sleepy to eat well.  Your baby is having trouble sleeping.   Your baby is wetting  less than 3 diapers in a 24-hour period.  Your baby has less than 3 stools in a 24-hour period.  Your baby's skin or the white part of his or her eyes becomes yellow.   Your baby is not gaining weight by 5 days of age. SEEK IMMEDIATE MEDICAL CARE IF:   Your baby is overly tired (lethargic) and does not want to wake up and feed.  Your baby develops an unexplained fever. Document Released: 01/02/2005 Document Revised: 01/07/2013 Document Reviewed: 06/26/2012 [employer] Patient Information 2015 [employer]. This information is not intended to replace advice given to you by your health care provider. Make sure you discuss any questions you have with your health care provider.  

## 2014-06-11 LAB — CULTURE, BETA STREP (GROUP B ONLY)

## 2014-06-12 ENCOUNTER — Encounter: Payer: Self-pay | Admitting: Family Medicine

## 2014-06-12 DIAGNOSIS — O99820 Streptococcus B carrier state complicating pregnancy: Secondary | ICD-10-CM | POA: Insufficient documentation

## 2014-06-16 ENCOUNTER — Ambulatory Visit (INDEPENDENT_AMBULATORY_CARE_PROVIDER_SITE_OTHER): Payer: Medicaid Other | Admitting: Family Medicine

## 2014-06-16 VITALS — BP 118/75 | HR 69 | Wt 191.0 lb

## 2014-06-16 DIAGNOSIS — Z36 Encounter for antenatal screening of mother: Secondary | ICD-10-CM | POA: Diagnosis not present

## 2014-06-16 DIAGNOSIS — Z3403 Encounter for supervision of normal first pregnancy, third trimester: Secondary | ICD-10-CM

## 2014-06-16 LAB — OB RESULTS CONSOLE GC/CHLAMYDIA
CHLAMYDIA, DNA PROBE: NEGATIVE
Gonorrhea: NEGATIVE

## 2014-06-16 NOTE — Patient Instructions (Addendum)
Breastfeeding Deciding to breastfeed is one of the best choices you can make for you and your baby. A change in hormones during pregnancy causes your breast tissue to grow and increases the number and size of your milk ducts. These hormones also allow proteins, sugars, and fats from your blood supply to make breast milk in your milk-producing glands. Hormones prevent breast milk from being released before your baby is born as well as prompt milk flow after birth. Once breastfeeding has begun, thoughts of your baby, as well as his or her sucking or crying, can stimulate the release of milk from your milk-producing glands.  BENEFITS OF BREASTFEEDING For Your Baby  Your first milk (colostrum) helps your baby's digestive system function better.   There are antibodies in your milk that help your baby fight off infections.   Your baby has a lower incidence of asthma, allergies, and sudden infant death syndrome.   The nutrients in breast milk are better for your baby than infant formulas and are designed uniquely for your baby's needs.   Breast milk improves your baby's brain development.   Your baby is less likely to develop other conditions, such as childhood obesity, asthma, or type 2 diabetes mellitus.  For You   Breastfeeding helps to create a very special bond between you and your baby.   Breastfeeding is convenient. Breast milk is always available at the correct temperature and costs nothing.   Breastfeeding helps to burn calories and helps you lose the weight gained during pregnancy.   Breastfeeding makes your uterus contract to its prepregnancy size faster and slows bleeding (lochia) after you give birth.   Breastfeeding helps to lower your risk of developing type 2 diabetes mellitus, osteoporosis, and breast or ovarian cancer later in life. SIGNS THAT YOUR BABY IS HUNGRY Early Signs of Hunger  Increased alertness or activity.  Stretching.  Movement of the head from  side to side.  Movement of the head and opening of the mouth when the corner of the mouth or cheek is stroked (rooting).  Increased sucking sounds, smacking lips, cooing, sighing, or squeaking.  Hand-to-mouth movements.  Increased sucking of fingers or hands. Late Signs of Hunger  Fussing.  Intermittent crying. Extreme Signs of Hunger Signs of extreme hunger will require calming and consoling before your baby will be able to breastfeed successfully. Do not wait for the following signs of extreme hunger to occur before you initiate breastfeeding:   Restlessness.  A loud, strong cry.   Screaming. BREASTFEEDING BASICS Breastfeeding Initiation  Find a comfortable place to sit or lie down, with your neck and back well supported.  Place a pillow or rolled up blanket under your baby to bring him or her to the level of your breast (if you are seated). Nursing pillows are specially designed to help support your arms and your baby while you breastfeed.  Make sure that your baby's abdomen is facing your abdomen.   Gently massage your breast. With your fingertips, massage from your chest wall toward your nipple in a circular motion. This encourages milk flow. You may need to continue this action during the feeding if your milk flows slowly.  Support your breast with 4 fingers underneath and your thumb above your nipple. Make sure your fingers are well away from your nipple and your baby's mouth.   Stroke your baby's lips gently with your finger or nipple.   When your baby's mouth is open wide enough, quickly bring your baby to your   breast, placing your entire nipple and as much of the colored area around your nipple (areola) as possible into your baby's mouth.   More areola should be visible above your baby's upper lip than below the lower lip.   Your baby's tongue should be between his or her lower gum and your breast.   Ensure that your baby's mouth is correctly positioned  around your nipple (latched). Your baby's lips should create a seal on your breast and be turned out (everted).  It is common for your baby to suck about 2-3 minutes in order to start the flow of breast milk. Latching Teaching your baby how to latch on to your breast properly is very important. An improper latch can cause nipple pain and decreased milk supply for you and poor weight gain in your baby. Also, if your baby is not latched onto your nipple properly, he or she may swallow some air during feeding. This can make your baby fussy. Burping your baby when you switch breasts during the feeding can help to get rid of the air. However, teaching your baby to latch on properly is still the best way to prevent fussiness from swallowing air while breastfeeding. Signs that your baby has successfully latched on to your nipple:    Silent tugging or silent sucking, without causing you pain.   Swallowing heard between every 3-4 sucks.    Muscle movement above and in front of his or her ears while sucking.  Signs that your baby has not successfully latched on to nipple:   Sucking sounds or smacking sounds from your baby while breastfeeding.  Nipple pain. If you think your baby has not latched on correctly, slip your finger into the corner of your baby's mouth to break the suction and place it between your baby's gums. Attempt breastfeeding initiation again. Signs of Successful Breastfeeding Signs from your baby:   A gradual decrease in the number of sucks or complete cessation of sucking.   Falling asleep.   Relaxation of his or her body.   Retention of a small amount of milk in his or her mouth.   Letting go of your breast by himself or herself. Signs from you:  Breasts that have increased in firmness, weight, and size 1-3 hours after feeding.   Breasts that are softer immediately after breastfeeding.  Increased milk volume, as well as a change in milk consistency and color by  the fifth day of breastfeeding.   Nipples that are not sore, cracked, or bleeding. Signs That Your Baby is Getting Enough Milk  Wetting at least 3 diapers in a 24-hour period. The urine should be clear and pale yellow by age 5 days.  At least 3 stools in a 24-hour period by age 5 days. The stool should be soft and yellow.  At least 3 stools in a 24-hour period by age 7 days. The stool should be seedy and yellow.  No loss of weight greater than 10% of birth weight during the first 3 days of age.  Average weight gain of 4-7 ounces (113-198 g) per week after age 4 days.  Consistent daily weight gain by age 5 days, without weight loss after the age of 2 weeks. After a feeding, your baby may spit up a small amount. This is common. BREASTFEEDING FREQUENCY AND DURATION Frequent feeding will help you make more milk and can prevent sore nipples and breast engorgement. Breastfeed when you feel the need to reduce the fullness of your breasts   or when your baby shows signs of hunger. This is called "breastfeeding on demand." Avoid introducing a pacifier to your baby while you are working to establish breastfeeding (the first 4-6 weeks after your baby is born). After this time you may choose to use a pacifier. Research has shown that pacifier use during the first year of a baby's life decreases the risk of sudden infant death syndrome (SIDS). Allow your baby to feed on each breast as long as he or she wants. Breastfeed until your baby is finished feeding. When your baby unlatches or falls asleep while feeding from the first breast, offer the second breast. Because newborns are often sleepy in the first few weeks of life, you may need to awaken your baby to get him or her to feed. Breastfeeding times will vary from baby to baby. However, the following rules can serve as a guide to help you ensure that your baby is properly fed:  Newborns (babies 4 weeks of age or younger) may breastfeed every 1-3  hours.  Newborns should not go longer than 3 hours during the day or 5 hours during the night without breastfeeding.  You should breastfeed your baby a minimum of 8 times in a 24-hour period until you begin to introduce solid foods to your baby at around 6 months of age. BREAST MILK PUMPING Pumping and storing breast milk allows you to ensure that your baby is exclusively fed your breast milk, even at times when you are unable to breastfeed. This is especially important if you are going back to work while you are still breastfeeding or when you are not able to be present during feedings. Your lactation consultant can give you guidelines on how long it is safe to store breast milk.  A breast pump is a machine that allows you to pump milk from your breast into a sterile bottle. The pumped breast milk can then be stored in a refrigerator or freezer. Some breast pumps are operated by hand, while others use electricity. Ask your lactation consultant which type will work best for you. Breast pumps can be purchased, but some hospitals and breastfeeding support groups lease breast pumps on a monthly basis. A lactation consultant can teach you how to hand express breast milk, if you prefer not to use a pump.  CARING FOR YOUR BREASTS WHILE YOU BREASTFEED Nipples can become dry, cracked, and sore while breastfeeding. The following recommendations can help keep your breasts moisturized and healthy:  Avoid using soap on your nipples.   Wear a supportive bra. Although not required, special nursing bras and tank tops are designed to allow access to your breasts for breastfeeding without taking off your entire bra or top. Avoid wearing underwire-style bras or extremely tight bras.  Air dry your nipples for 3-4minutes after each feeding.   Use only cotton bra pads to absorb leaked breast milk. Leaking of breast milk between feedings is normal.   Use lanolin on your nipples after breastfeeding. Lanolin helps to  maintain your skin's normal moisture barrier. If you use pure lanolin, you do not need to wash it off before feeding your baby again. Pure lanolin is not toxic to your baby. You may also hand express a few drops of breast milk and gently massage that milk into your nipples and allow the milk to air dry. In the first few weeks after giving birth, some women experience extremely full breasts (engorgement). Engorgement can make your breasts feel heavy, warm, and tender to the   touch. Engorgement peaks within 3-5 days after you give birth. The following recommendations can help ease engorgement:  Completely empty your breasts while breastfeeding or pumping. You may want to start by applying warm, moist heat (in the shower or with warm water-soaked hand towels) just before feeding or pumping. This increases circulation and helps the milk flow. If your baby does not completely empty your breasts while breastfeeding, pump any extra milk after he or she is finished.  Wear a snug bra (nursing or regular) or tank top for 1-2 days to signal your body to slightly decrease milk production.  Apply ice packs to your breasts, unless this is too uncomfortable for you.  Make sure that your baby is latched on and positioned properly while breastfeeding. If engorgement persists after 48 hours of following these recommendations, contact your health care provider or a Science writer. OVERALL HEALTH CARE RECOMMENDATIONS WHILE BREASTFEEDING  Eat healthy foods. Alternate between meals and snacks, eating 3 of each per day. Because what you eat affects your breast milk, some of the foods may make your baby more irritable than usual. Avoid eating these foods if you are sure that they are negatively affecting your baby.  Drink milk, fruit juice, and water to satisfy your thirst (about 10 glasses a day).   Rest often, relax, and continue to take your prenatal vitamins to prevent fatigue, stress, and anemia.  Continue  breast self-awareness checks.  Avoid chewing and smoking tobacco.  Avoid alcohol and drug use. Some medicines that may be harmful to your baby can pass through breast milk. It is important to ask your health care provider before taking any medicine, including all over-the-counter and prescription medicine as well as vitamin and herbal supplements. It is possible to become pregnant while breastfeeding. If birth control is desired, ask your health care provider about options that will be safe for your baby. SEEK MEDICAL CARE IF:   You feel like you want to stop breastfeeding or have become frustrated with breastfeeding.  You have painful breasts or nipples.  Your nipples are cracked or bleeding.  Your breasts are red, tender, or warm.  You have a swollen area on either breast.  You have a fever or chills.  You have nausea or vomiting.  You have drainage other than breast milk from your nipples.  Your breasts do not become full before feedings by the fifth day after you give birth.  You feel sad and depressed.  Your baby is too sleepy to eat well.  Your baby is having trouble sleeping.   Your baby is wetting less than 3 diapers in a 24-hour period.  Your baby has less than 3 stools in a 24-hour period.  Your baby's skin or the white part of his or her eyes becomes yellow.   Your baby is not gaining weight by 79 days of age. SEEK IMMEDIATE MEDICAL CARE IF:   Your baby is overly tired (lethargic) and does not want to wake up and feed.  Your baby develops an unexplained fever. Document Released: 01/02/2005 Document Revised: 01/07/2013 Document Reviewed: 06/26/2012 [facility] Patient Information 2015 [place], [place]. This information is not intended to replace advice given to you by your health care provider. Make sure you discuss any questions you have with your health care provider. Vaginal Delivery During delivery, your health care provider will help you give birth to your  baby. During a vaginal delivery, you will work to push the baby out of your vagina. However, before you  can push your baby out, a few things need to happen. The opening of your uterus (cervix) has to soften, thin out, and open up (dilate) all the way to 10 cm. Also, your baby has to move down from the uterus into your vagina.  SIGNS OF LABOR  Your health care provider will first need to make sure you are in labor. Signs of labor include:   Passing what is called the mucous plug before labor begins. This is a small amount of blood-stained mucus.  Having regular, painful uterine contractions.   The time between contractions gets shorter.   The discomfort and pain gradually get more intense.  Contraction pains get worse when walking and do not go away when resting.   Your cervix becomes thinner (effacement) and dilates. BEFORE THE DELIVERY Once you are in labor and admitted into the hospital or care center, your health care provider may do the following:   Perform a complete physical exam.  Review any complications related to pregnancy or labor.  Check your blood pressure, pulse, temperature, and heart rate (vital signs).   Determine if, and when, the rupture of amniotic membranes occurred.  Do a vaginal exam (using a sterile glove and lubricant) to determine:   The position (presentation) of the baby. Is the baby's head presenting first (vertex) in the birth canal (vagina), or are the feet or buttocks first (breech)?   The level (station) of the baby's head within the birth canal.   The effacement and dilatation of the cervix.   An electronic fetal monitor is usually placed on your abdomen when you first arrive. This is used to monitor your contractions and the baby's heart rate.  When the monitor is on your abdomen (external fetal monitor), it can only pick up the frequency and length of your contractions. It cannot tell the strength of your contractions.  If it becomes  necessary for your health care provider to know exactly how strong your contractions are or to see exactly what the baby's heart rate is doing, an internal monitor may be inserted into your vagina and uterus. Your health care provider will discuss the benefits and risks of using an internal monitor and obtain your permission before inserting the device.  Continuous fetal monitoring may be needed if you have an epidural, are receiving certain medicines (such as oxytocin), or have pregnancy or labor complications.  An IV access tube may be placed into a vein in your arm to deliver fluids and medicines if necessary. THREE STAGES OF LABOR AND DELIVERY Normal labor and delivery is divided into three stages. First Stage This stage starts when you begin to contract regularly and your cervix begins to efface and dilate. It ends when your cervix is completely open (fully dilated). The first stage is the longest stage of labor and can last from 3 hours to 15 hours.  Several methods are available to help with labor pain. You and your health care provider will decide which option is best for you. Options include:   Opioid medicines. These are strong pain medicines that you can get through your IV tube or as a shot into your muscle. These medicines lessen pain but do not make it go away completely.  Epidural. A medicine is given through a thin tube that is inserted in your back. The medicine numbs the lower part of your body and prevents any pain in that area.  Paracervical pain medicine. This is an injection of an anesthetic on each  side of your cervix.   You may request natural childbirth, which does not involve the use of pain medicines or an epidural during labor and delivery. Instead, you will use other things, such as breathing exercises, to help cope with the pain. Second Stage The second stage of labor begins when your cervix is fully dilated at 10 cm. It continues until you push your baby down  through the birth canal and the baby is born. This stage can take only minutes or several hours.  The location of your baby's head as it moves through the birth canal is reported as a number called a station. If the baby's head has not started its descent, the station is described as being at minus 3 (-3). When your baby's head is at the zero station, it is at the middle of the birth canal and is engaged in the pelvis. The station of your baby helps indicate the progress of the second stage of labor.  When your baby is born, your health care provider may hold the baby with his or her head lowered to prevent amniotic fluid, mucus, and blood from getting into the baby's lungs. The baby's mouth and nose may be suctioned with a small bulb syringe to remove any additional fluid.  Your health care provider may then place the baby on your stomach. It is important to keep the baby from getting cold. To do this, the health care provider will dry the baby off, place the baby directly on your skin (with no blankets between you and the baby), and cover the baby with warm, dry blankets.   The umbilical cord is cut. Third Stage During the third stage of labor, your health care provider will deliver the placenta (afterbirth) and make sure your bleeding is under control. The delivery of the placenta usually takes about 5 minutes but can take up to 30 minutes. After the placenta is delivered, a medicine may be given either by IV or injection to help contract the uterus and control bleeding. If you are planning to breastfeed, you can try to do so now. After you deliver the placenta, your uterus should contract and get very firm. If your uterus does not remain firm, your health care provider will massage it. This is important because the contraction of the uterus helps cut off bleeding at the site where the placenta was attached to your uterus. If your uterus does not contract properly and stay firm, you may continue to  bleed heavily. If there is a lot of bleeding, medicines may be given to contract the uterus and stop the bleeding.  Document Released: 10/12/2007 Document Revised: 05/19/2013 Document Reviewed: 06/23/2012 [employer] Patient Information 2015 [employer]. This information is not intended to replace advice given to you by your health care provider. Make sure you discuss any questions you have with your health care provider.  

## 2014-06-16 NOTE — Progress Notes (Signed)
Subjective:   Shakiyla Adriana MccallumM Dougher is a 24 y.o. G3P0020 5818w0d being seen today for her obstetrical visit.  Patient reports no complaints.  Denies contractions, vaginal bleeding or leaking of fluid.  Reports good fetal movement.  The following portions of the patient's history were reviewed and updated as appropriate: allergies, current medications, past family history, past medical history, past social history, past surgical history and problem list.   Objective:  BP 118/75 mmHg  Pulse 69  Wt 191 lb (86.637 kg)  LMP 09/30/2013  FHT: Fetal Heart Rate (bpm): 138  Uterine Size: Fundal Height: 37 cm  Fetal Movement: Movement: Present  Presentation: Presentation: Vertex    Abdomen:  soft, gravid, appropriate for gestational age,non-tender  Cervix: 1cm,Long,-2, medium,posterior    No results found for this or any previous visit (from the past 24 hour(s)).  Assessment and Plan:   Pregnancy:  G3P0020 at 3118w0d  1. Supervision of normal first pregnancy in third trimester Doing well  - GC/chlamydia probe amp, urine Labor symptoms: vaginal bleeding, contractions and leaking of fluid reviewed in detail.  Fetal movement precautions reviewed.  Follow up in 1 week.  Reva Boresanya S Alexi Geibel, MD

## 2014-06-17 LAB — GC/CHLAMYDIA PROBE AMP, URINE
Chlamydia, Swab/Urine, PCR: NEGATIVE
GC PROBE AMP, URINE: NEGATIVE

## 2014-06-23 ENCOUNTER — Ambulatory Visit (INDEPENDENT_AMBULATORY_CARE_PROVIDER_SITE_OTHER): Payer: Medicaid Other | Admitting: Obstetrics and Gynecology

## 2014-06-23 ENCOUNTER — Encounter: Payer: Self-pay | Admitting: Obstetrics and Gynecology

## 2014-06-23 VITALS — BP 122/78 | HR 69 | Wt 191.0 lb

## 2014-06-23 DIAGNOSIS — Z3403 Encounter for supervision of normal first pregnancy, third trimester: Secondary | ICD-10-CM

## 2014-06-23 DIAGNOSIS — O99820 Streptococcus B carrier state complicating pregnancy: Secondary | ICD-10-CM

## 2014-06-23 DIAGNOSIS — Z22330 Carrier of Group B streptococcus: Secondary | ICD-10-CM

## 2014-06-23 NOTE — Progress Notes (Signed)
Subjective:   Autumn Crawford is a 24 y.o. G3P0020 at 2385w0d being seen today for her obstetrical visit.  Patient reports no complaints.  Contractions: Not present.  Vag. Bleeding: None.  Fetal Movement: Present. Denies contractions leaking of fluid.   The following portions of the patient's history were reviewed and updated as appropriate: allergies, current medications, past family history, past medical history, past social history, past surgical history and problem list.   Objective:  BP 122/78 mmHg  Pulse 69  Wt 191 lb (86.637 kg)  LMP 09/30/2013  Fetal Status: Fetal Heart Rate (bpm): 146   Movement: Present     General:  Alert, oriented and cooperative. Patient is in no acute distress.  Skin: Skin is warm and dry. No rash noted.   Cardiovascular: Normal heart rate noted, normal rhythm  Respiratory: Effort and breath sounds normal, no problems with respiration noted  Abdomen: Soft, gravid, appropriate for gestational age. Pain/Pressure: Present     Vaginal: Vag. Bleeding: None.    Vag D/C Character: Thin  Cervix: 1/20/-2  Extremities: Normal range of motion.  Edema: None  Mental Status: Normal mood and affect. Normal behavior. Normal judgment and thought content.   Urinalysis: Urine Protein: Negative Urine Glucose: Negative   Assessment and Plan:   Pregnancy:  G3P0020 at 5985w0d  1. Encounter for supervision of normal first pregnancy in third trimester Doing well  2. Group B Streptococcus carrier, +RV culture, currently pregnant Will treat in labor   Labor symptoms and precations: vaginal bleeding, contractions and leaking of fluid reviewed in detail.  Fetal movement precautions also reviewed. Please refer to After Visit Summary for other counseling recommendations.   Return in about 1 week (around 06/30/2014).   Catalina AntiguaPeggy Merleen Picazo, MD

## 2014-06-30 ENCOUNTER — Encounter: Payer: Self-pay | Admitting: Family Medicine

## 2014-06-30 ENCOUNTER — Ambulatory Visit (INDEPENDENT_AMBULATORY_CARE_PROVIDER_SITE_OTHER): Payer: Medicaid Other | Admitting: Family Medicine

## 2014-06-30 VITALS — BP 125/80 | HR 67 | Wt 193.2 lb

## 2014-06-30 DIAGNOSIS — Z3403 Encounter for supervision of normal first pregnancy, third trimester: Secondary | ICD-10-CM

## 2014-06-30 NOTE — Patient Instructions (Signed)
Third Trimester of Pregnancy The third trimester is from week 29 through week 42, months 7 through 9. The third trimester is a time when the fetus is growing rapidly. At the end of the ninth month, the fetus is about 20 inches in length and weighs 6-10 pounds.  BODY CHANGES Your body goes through many changes during pregnancy. The changes vary from woman to woman.   Your weight will continue to increase. You can expect to gain 25-35 pounds (11-16 kg) by the end of the pregnancy.  You may begin to get stretch marks on your hips, abdomen, and breasts.  You may urinate more often because the fetus is moving lower into your pelvis and pressing on your bladder.  You may develop or continue to have heartburn as a result of your pregnancy.  You may develop constipation because certain hormones are causing the muscles that push waste through your intestines to slow down.  You may develop hemorrhoids or swollen, bulging veins (varicose veins).  You may have pelvic pain because of the weight gain and pregnancy hormones relaxing your joints between the bones in your pelvis. Backaches may result from overexertion of the muscles supporting your posture.  You may have changes in your hair. These can include thickening of your hair, rapid growth, and changes in texture. Some women also have hair loss during or after pregnancy, or hair that feels dry or thin. Your hair will most likely return to normal after your baby is born.  Your breasts will continue to grow and be tender. A yellow discharge may leak from your breasts called colostrum.  Your belly button may stick out.  You may feel short of breath because of your expanding uterus.  You may notice the fetus "dropping," or moving lower in your abdomen.  You may have a bloody mucus discharge. This usually occurs a few days to a week before labor begins.  Your cervix becomes thin and soft (effaced) near your due date. WHAT TO EXPECT AT YOUR  PRENATAL EXAMS  You will have prenatal exams every 2 weeks until week 36. Then, you will have weekly prenatal exams. During a routine prenatal visit:  You will be weighed to make sure you and the fetus are growing normally.  Your blood pressure is taken.  Your abdomen will be measured to track your baby's growth.  The fetal heartbeat will be listened to.  Any test results from the previous visit will be discussed.  You may have a cervical check near your due date to see if you have effaced. At around 36 weeks, your caregiver will check your cervix. At the same time, your caregiver will also perform a test on the secretions of the vaginal tissue. This test is to determine if a type of bacteria, Group B streptococcus, is present. Your caregiver will explain this further. Your caregiver may ask you:  What your birth plan is.  How you are feeling.  If you are feeling the baby move.  If you have had any abnormal symptoms, such as leaking fluid, bleeding, severe headaches, or abdominal cramping.  If you have any questions. Other tests or screenings that may be performed during your third trimester include:  Blood tests that check for low iron levels (anemia).  Fetal testing to check the health, activity level, and growth of the fetus. Testing is done if you have certain medical conditions or if there are problems during the pregnancy. FALSE LABOR You may feel small, irregular contractions that   eventually go away. These are called Braxton Hicks contractions, or false labor. Contractions may last for hours, days, or even weeks before true labor sets in. If contractions come at regular intervals, intensify, or become painful, it is best to be seen by your caregiver.  SIGNS OF LABOR   Menstrual-like cramps.  Contractions that are 5 minutes apart or less.  Contractions that start on the top of the uterus and spread down to the lower abdomen and back.  A sense of increased pelvic  pressure or back pain.  A watery or bloody mucus discharge that comes from the vagina. If you have any of these signs before the 37th week of pregnancy, call your caregiver right away. You need to go to the hospital to get checked immediately. HOME CARE INSTRUCTIONS   Avoid all smoking, herbs, alcohol, and unprescribed drugs. These chemicals affect the formation and growth of the baby.  Follow your caregiver's instructions regarding medicine use. There are medicines that are either safe or unsafe to take during pregnancy.  Exercise only as directed by your caregiver. Experiencing uterine cramps is a good sign to stop exercising.  Continue to eat regular, healthy meals.  Wear a good support bra for breast tenderness.  Do not use hot tubs, steam rooms, or saunas.  Wear your seat belt at all times when driving.  Avoid raw meat, uncooked cheese, cat litter boxes, and soil used by cats. These carry germs that can cause birth defects in the baby.  Take your prenatal vitamins.  Try taking a stool softener (if your caregiver approves) if you develop constipation. Eat more high-fiber foods, such as fresh vegetables or fruit and whole grains. Drink plenty of fluids to keep your urine clear or pale yellow.  Take warm sitz baths to soothe any pain or discomfort caused by hemorrhoids. Use hemorrhoid cream if your caregiver approves.  If you develop varicose veins, wear support hose. Elevate your feet for 15 minutes, 3-4 times a day. Limit salt in your diet.  Avoid heavy lifting, wear low heal shoes, and practice good posture.  Rest a lot with your legs elevated if you have leg cramps or low back pain.  Visit your dentist if you have not gone during your pregnancy. Use a soft toothbrush to brush your teeth and be gentle when you floss.  A sexual relationship may be continued unless your caregiver directs you otherwise.  Do not travel far distances unless it is absolutely necessary and only  with the approval of your caregiver.  Take prenatal classes to understand, practice, and ask questions about the labor and delivery.  Make a trial run to the hospital.  Pack your hospital bag.  Prepare the baby's nursery.  Continue to go to all your prenatal visits as directed by your caregiver. SEEK MEDICAL CARE IF:  You are unsure if you are in labor or if your water has broken.  You have dizziness.  You have mild pelvic cramps, pelvic pressure, or nagging pain in your abdominal area.  You have persistent nausea, vomiting, or diarrhea.  You have a bad smelling vaginal discharge.  You have pain with urination. SEEK IMMEDIATE MEDICAL CARE IF:   You have a fever.  You are leaking fluid from your vagina.  You have spotting or bleeding from your vagina.  You have severe abdominal cramping or pain.  You have rapid weight loss or gain.  You have shortness of breath with chest pain.  You notice sudden or extreme swelling   of your face, hands, ankles, feet, or legs.  You have not felt your baby move in over an hour.  You have severe headaches that do not go away with medicine.  You have vision changes. Document Released: 12/27/2000 Document Revised: 01/07/2013 Document Reviewed: 03/05/2012 [employer] Patient Information 2015 [employer]. This information is not intended to replace advice given to you by your health care provider. Make sure you discuss any questions you have with your health care provider.  Breastfeeding Deciding to breastfeed is one of the best choices you can make for you and your baby. A change in hormones during pregnancy causes your breast tissue to grow and increases the number and size of your milk ducts. These hormones also allow proteins, sugars, and fats from your blood supply to make breast milk in your milk-producing glands. Hormones prevent breast milk from being released before your baby is born as well as prompt milk flow after birth. Once  breastfeeding has begun, thoughts of your baby, as well as his or her sucking or crying, can stimulate the release of milk from your milk-producing glands.  BENEFITS OF BREASTFEEDING For Your Baby  Your first milk (colostrum) helps your baby's digestive system function better.   There are antibodies in your milk that help your baby fight off infections.   Your baby has a lower incidence of asthma, allergies, and sudden infant death syndrome.   The nutrients in breast milk are better for your baby than infant formulas and are designed uniquely for your baby's needs.   Breast milk improves your baby's brain development.   Your baby is less likely to develop other conditions, such as childhood obesity, asthma, or type 2 diabetes mellitus.  For You   Breastfeeding helps to create a very special bond between you and your baby.   Breastfeeding is convenient. Breast milk is always available at the correct temperature and costs nothing.   Breastfeeding helps to burn calories and helps you lose the weight gained during pregnancy.   Breastfeeding makes your uterus contract to its prepregnancy size faster and slows bleeding (lochia) after you give birth.   Breastfeeding helps to lower your risk of developing type 2 diabetes mellitus, osteoporosis, and breast or ovarian cancer later in life. SIGNS THAT YOUR BABY IS HUNGRY Early Signs of Hunger  Increased alertness or activity.  Stretching.  Movement of the head from side to side.  Movement of the head and opening of the mouth when the corner of the mouth or cheek is stroked (rooting).  Increased sucking sounds, smacking lips, cooing, sighing, or squeaking.  Hand-to-mouth movements.  Increased sucking of fingers or hands. Late Signs of Hunger  Fussing.  Intermittent crying. Extreme Signs of Hunger Signs of extreme hunger will require calming and consoling before your baby will be able to breastfeed successfully. Do not  wait for the following signs of extreme hunger to occur before you initiate breastfeeding:   Restlessness.  A loud, strong cry.   Screaming. BREASTFEEDING BASICS Breastfeeding Initiation  Find a comfortable place to sit or lie down, with your neck and back well supported.  Place a pillow or rolled up blanket under your baby to bring him or her to the level of your breast (if you are seated). Nursing pillows are specially designed to help support your arms and your baby while you breastfeed.  Make sure that your baby's abdomen is facing your abdomen.   Gently massage your breast. With your fingertips, massage from your chest   wall toward your nipple in a circular motion. This encourages milk flow. You may need to continue this action during the feeding if your milk flows slowly.  Support your breast with 4 fingers underneath and your thumb above your nipple. Make sure your fingers are well away from your nipple and your baby's mouth.   Stroke your baby's lips gently with your finger or nipple.   When your baby's mouth is open wide enough, quickly bring your baby to your breast, placing your entire nipple and as much of the colored area around your nipple (areola) as possible into your baby's mouth.   More areola should be visible above your baby's upper lip than below the lower lip.   Your baby's tongue should be between his or her lower gum and your breast.   Ensure that your baby's mouth is correctly positioned around your nipple (latched). Your baby's lips should create a seal on your breast and be turned out (everted).  It is common for your baby to suck about 2-3 minutes in order to start the flow of breast milk. Latching Teaching your baby how to latch on to your breast properly is very important. An improper latch can cause nipple pain and decreased milk supply for you and poor weight gain in your baby. Also, if your baby is not latched onto your nipple properly, he or she  may swallow some air during feeding. This can make your baby fussy. Burping your baby when you switch breasts during the feeding can help to get rid of the air. However, teaching your baby to latch on properly is still the best way to prevent fussiness from swallowing air while breastfeeding. Signs that your baby has successfully latched on to your nipple:    Silent tugging or silent sucking, without causing you pain.   Swallowing heard between every 3-4 sucks.    Muscle movement above and in front of his or her ears while sucking.  Signs that your baby has not successfully latched on to nipple:   Sucking sounds or smacking sounds from your baby while breastfeeding.  Nipple pain. If you think your baby has not latched on correctly, slip your finger into the corner of your baby's mouth to break the suction and place it between your baby's gums. Attempt breastfeeding initiation again. Signs of Successful Breastfeeding Signs from your baby:   A gradual decrease in the number of sucks or complete cessation of sucking.   Falling asleep.   Relaxation of his or her body.   Retention of a small amount of milk in his or her mouth.   Letting go of your breast by himself or herself. Signs from you:  Breasts that have increased in firmness, weight, and size 1-3 hours after feeding.   Breasts that are softer immediately after breastfeeding.  Increased milk volume, as well as a change in milk consistency and color by the fifth day of breastfeeding.   Nipples that are not sore, cracked, or bleeding. Signs That Your Baby is Getting Enough Milk  Wetting at least 3 diapers in a 24-hour period. The urine should be clear and pale yellow by age 5 days.  At least 3 stools in a 24-hour period by age 5 days. The stool should be soft and yellow.  At least 3 stools in a 24-hour period by age 7 days. The stool should be seedy and yellow.  No loss of weight greater than 10% of birth weight  during the first 3   days of age.  Average weight gain of 4-7 ounces (113-198 g) per week after age 4 days.  Consistent daily weight gain by age 5 days, without weight loss after the age of 2 weeks. After a feeding, your baby may spit up a small amount. This is common. BREASTFEEDING FREQUENCY AND DURATION Frequent feeding will help you make more milk and can prevent sore nipples and breast engorgement. Breastfeed when you feel the need to reduce the fullness of your breasts or when your baby shows signs of hunger. This is called "breastfeeding on demand." Avoid introducing a pacifier to your baby while you are working to establish breastfeeding (the first 4-6 weeks after your baby is born). After this time you may choose to use a pacifier. Research has shown that pacifier use during the first year of a baby's life decreases the risk of sudden infant death syndrome (SIDS). Allow your baby to feed on each breast as long as he or she wants. Breastfeed until your baby is finished feeding. When your baby unlatches or falls asleep while feeding from the first breast, offer the second breast. Because newborns are often sleepy in the first few weeks of life, you may need to awaken your baby to get him or her to feed. Breastfeeding times will vary from baby to baby. However, the following rules can serve as a guide to help you ensure that your baby is properly fed:  Newborns (babies 4 weeks of age or younger) may breastfeed every 1-3 hours.  Newborns should not go longer than 3 hours during the day or 5 hours during the night without breastfeeding.  You should breastfeed your baby a minimum of 8 times in a 24-hour period until you begin to introduce solid foods to your baby at around 6 months of age. BREAST MILK PUMPING Pumping and storing breast milk allows you to ensure that your baby is exclusively fed your breast milk, even at times when you are unable to breastfeed. This is especially important if you are  going back to work while you are still breastfeeding or when you are not able to be present during feedings. Your lactation consultant can give you guidelines on how long it is safe to store breast milk.  A breast pump is a machine that allows you to pump milk from your breast into a sterile bottle. The pumped breast milk can then be stored in a refrigerator or freezer. Some breast pumps are operated by hand, while others use electricity. Ask your lactation consultant which type will work best for you. Breast pumps can be purchased, but some hospitals and breastfeeding support groups lease breast pumps on a monthly basis. A lactation consultant can teach you how to hand express breast milk, if you prefer not to use a pump.  CARING FOR YOUR BREASTS WHILE YOU BREASTFEED Nipples can become dry, cracked, and sore while breastfeeding. The following recommendations can help keep your breasts moisturized and healthy:  Avoid using soap on your nipples.   Wear a supportive bra. Although not required, special nursing bras and tank tops are designed to allow access to your breasts for breastfeeding without taking off your entire bra or top. Avoid wearing underwire-style bras or extremely tight bras.  Air dry your nipples for 3-4minutes after each feeding.   Use only cotton bra pads to absorb leaked breast milk. Leaking of breast milk between feedings is normal.   Use lanolin on your nipples after breastfeeding. Lanolin helps to maintain your skin's   normal moisture barrier. If you use pure lanolin, you do not need to wash it off before feeding your baby again. Pure lanolin is not toxic to your baby. You may also hand express a few drops of breast milk and gently massage that milk into your nipples and allow the milk to air dry. In the first few weeks after giving birth, some women experience extremely full breasts (engorgement). Engorgement can make your breasts feel heavy, warm, and tender to the touch.  Engorgement peaks within 3-5 days after you give birth. The following recommendations can help ease engorgement:  Completely empty your breasts while breastfeeding or pumping. You may want to start by applying warm, moist heat (in the shower or with warm water-soaked hand towels) just before feeding or pumping. This increases circulation and helps the milk flow. If your baby does not completely empty your breasts while breastfeeding, pump any extra milk after he or she is finished.  Wear a snug bra (nursing or regular) or tank top for 1-2 days to signal your body to slightly decrease milk production.  Apply ice packs to your breasts, unless this is too uncomfortable for you.  Make sure that your baby is latched on and positioned properly while breastfeeding. If engorgement persists after 48 hours of following these recommendations, contact your health care provider or a lactation consultant. OVERALL HEALTH CARE RECOMMENDATIONS WHILE BREASTFEEDING  Eat healthy foods. Alternate between meals and snacks, eating 3 of each per day. Because what you eat affects your breast milk, some of the foods may make your baby more irritable than usual. Avoid eating these foods if you are sure that they are negatively affecting your baby.  Drink milk, fruit juice, and water to satisfy your thirst (about 10 glasses a day).   Rest often, relax, and continue to take your prenatal vitamins to prevent fatigue, stress, and anemia.  Continue breast self-awareness checks.  Avoid chewing and smoking tobacco.  Avoid alcohol and drug use. Some medicines that may be harmful to your baby can pass through breast milk. It is important to ask your health care provider before taking any medicine, including all over-the-counter and prescription medicine as well as vitamin and herbal supplements. It is possible to become pregnant while breastfeeding. If birth control is desired, ask your health care provider about options that  will be safe for your baby. SEEK MEDICAL CARE IF:   You feel like you want to stop breastfeeding or have become frustrated with breastfeeding.  You have painful breasts or nipples.  Your nipples are cracked or bleeding.  Your breasts are red, tender, or warm.  You have a swollen area on either breast.  You have a fever or chills.  You have nausea or vomiting.  You have drainage other than breast milk from your nipples.  Your breasts do not become full before feedings by the fifth day after you give birth.  You feel sad and depressed.  Your baby is too sleepy to eat well.  Your baby is having trouble sleeping.   Your baby is wetting less than 3 diapers in a 24-hour period.  Your baby has less than 3 stools in a 24-hour period.  Your baby's skin or the white part of his or her eyes becomes yellow.   Your baby is not gaining weight by 5 days of age. SEEK IMMEDIATE MEDICAL CARE IF:   Your baby is overly tired (lethargic) and does not want to wake up and feed.  Your baby   develops an unexplained fever. Document Released: 01/02/2005 Document Revised: 01/07/2013 Document Reviewed: 06/26/2012 [employer] Patient Information 2015 [employer]. This information is not intended to replace advice given to you by your health care provider. Make sure you discuss any questions you have with your health care provider.  

## 2014-06-30 NOTE — Progress Notes (Signed)
Subjective:  Autumn Crawford is a 24 y.o. G3P0020 at [redacted]w[redacted]d being seen today for ongoing prenatal care.  Patient reports no complaints.  Contractions: Not present.  Vag. Bleeding: None. Movement: Present. Denies leaking of fluid.   The following portions of the patient's history were reviewed and updated as appropriate: allergies, current medications, past family history, past medical history, past social history, past surgical history and problem list.   Objective:   Filed Vitals:   06/30/14 1416  BP: 125/80  Pulse: 67  Weight: 193 lb 3.2 oz (87.635 kg)    Fetal Status: Fetal Heart Rate (bpm): 149 Fundal Height: 36 cm Movement: Present  Presentation: Vertex  General:  Alert, oriented and cooperative. Patient is in no acute distress.  Skin: Skin is warm and dry. No rash noted.   Cardiovascular: Normal heart rate noted  Respiratory: Effort and breath sounds normal, no problems with respiration noted  Abdomen: Soft, gravid, appropriate for gestational age. Pain/Pressure: Present     Vaginal: Vag. Bleeding: None.    Vag D/C Character: Thin  Cervix: Dilation: 2.5 Effacement (%): 70 Station: -2  Extremities: Normal range of motion.  Edema: None  Mental Status: Normal mood and affect. Normal behavior. Normal judgment and thought content.   Urinalysis: Urine Protein: Negative Urine Glucose: Negative  Assessment and Plan:  Pregnancy: G3P0020 at [redacted]w[redacted]d  1. Encounter for supervision of normal first pregnancy in third trimester Continue routine prenatal care.  Term labor symptoms and general obstetric precautions including but not limited to vaginal bleeding, contractions, leaking of fluid and fetal movement were reviewed in detail with the patient.  Please refer to After Visit Summary for other counseling recommendations.   Return in 1 week (on 07/07/2014) for OB visit and NST.   Reva Bores, MD

## 2014-07-01 ENCOUNTER — Inpatient Hospital Stay (HOSPITAL_COMMUNITY)
Admission: AD | Admit: 2014-07-01 | Discharge: 2014-07-01 | Disposition: A | Discharge status: Home / Self Care | Payer: Medicaid Other | Source: Ambulatory Visit | Attending: Obstetrics and Gynecology | Admitting: Obstetrics and Gynecology

## 2014-07-01 ENCOUNTER — Encounter (HOSPITAL_COMMUNITY): Payer: Self-pay

## 2014-07-01 ENCOUNTER — Inpatient Hospital Stay (HOSPITAL_COMMUNITY)
Admission: AD | Admit: 2014-07-01 | Discharge: 2014-07-04 | DRG: 775 | Disposition: A | Discharge status: Home / Self Care | Payer: Medicaid Other | Source: Ambulatory Visit | Attending: Obstetrics & Gynecology | Admitting: Obstetrics & Gynecology

## 2014-07-01 DIAGNOSIS — Z8249 Family history of ischemic heart disease and other diseases of the circulatory system: Secondary | ICD-10-CM

## 2014-07-01 DIAGNOSIS — Z3493 Encounter for supervision of normal pregnancy, unspecified, third trimester: Secondary | ICD-10-CM | POA: Insufficient documentation

## 2014-07-01 DIAGNOSIS — Z3A39 39 weeks gestation of pregnancy: Secondary | ICD-10-CM | POA: Insufficient documentation

## 2014-07-01 DIAGNOSIS — O99820 Streptococcus B carrier state complicating pregnancy: Secondary | ICD-10-CM

## 2014-07-01 DIAGNOSIS — E669 Obesity, unspecified: Secondary | ICD-10-CM | POA: Diagnosis present

## 2014-07-01 DIAGNOSIS — Z3403 Encounter for supervision of normal first pregnancy, third trimester: Secondary | ICD-10-CM

## 2014-07-01 DIAGNOSIS — O99214 Obesity complicating childbirth: Secondary | ICD-10-CM | POA: Diagnosis present

## 2014-07-01 DIAGNOSIS — Z825 Family history of asthma and other chronic lower respiratory diseases: Secondary | ICD-10-CM

## 2014-07-01 DIAGNOSIS — Z833 Family history of diabetes mellitus: Secondary | ICD-10-CM

## 2014-07-01 DIAGNOSIS — K219 Gastro-esophageal reflux disease without esophagitis: Secondary | ICD-10-CM | POA: Diagnosis present

## 2014-07-01 DIAGNOSIS — O99824 Streptococcus B carrier state complicating childbirth: Secondary | ICD-10-CM | POA: Diagnosis present

## 2014-07-01 DIAGNOSIS — O9962 Diseases of the digestive system complicating childbirth: Secondary | ICD-10-CM | POA: Diagnosis present

## 2014-07-01 DIAGNOSIS — Z6831 Body mass index (BMI) 31.0-31.9, adult: Secondary | ICD-10-CM

## 2014-07-01 DIAGNOSIS — IMO0001 Reserved for inherently not codable concepts without codable children: Secondary | ICD-10-CM

## 2014-07-01 NOTE — Discharge Instructions (Signed)
Fetal Movement Counts °Patient Name: __________________________________________________ Patient Due Date: ____________________ °Performing a fetal movement count is highly recommended in high-risk pregnancies, but it is good for every pregnant woman to do. Your health care provider may ask you to start counting fetal movements at 28 weeks of the pregnancy. Fetal movements often increase: °· After eating a full meal. °· After physical activity. °· After eating or drinking something sweet or cold. °· At rest. °Pay attention to when you feel the baby is most active. This will help you notice a pattern of your baby's sleep and wake cycles and what factors contribute to an increase in fetal movement. It is important to perform a fetal movement count at the same time each day when your baby is normally most active.  °HOW TO COUNT FETAL MOVEMENTS °1. Find a quiet and comfortable area to sit or lie down on your left side. Lying on your left side provides the best blood and oxygen circulation to your baby. °2. Write down the day and time on a sheet of paper or in a journal. °3. Start counting kicks, flutters, swishes, rolls, or jabs in a 2-hour period. You should feel at least 10 movements within 2 hours. °4. If you do not feel 10 movements in 2 hours, wait 2-3 hours and count again. Look for a change in the pattern or not enough counts in 2 hours. °SEEK MEDICAL CARE IF: °· You feel less than 10 counts in 2 hours, tried twice. °· There is no movement in over an hour. °· The pattern is changing or taking longer each day to reach 10 counts in 2 hours. °· You feel the baby is not moving as he or she usually does. °Date: ____________ Movements: ____________ Start time: ____________ Finish time: ____________  °Date: ____________ Movements: ____________ Start time: ____________ Finish time: ____________ °Date: ____________ Movements: ____________ Start time: ____________ Finish time: ____________ °Date: ____________ Movements:  ____________ Start time: ____________ Finish time: ____________ °Date: ____________ Movements: ____________ Start time: ____________ Finish time: ____________ °Date: ____________ Movements: ____________ Start time: ____________ Finish time: ____________ °Date: ____________ Movements: ____________ Start time: ____________ Finish time: ____________ °Date: ____________ Movements: ____________ Start time: ____________ Finish time: ____________  °Date: ____________ Movements: ____________ Start time: ____________ Finish time: ____________ °Date: ____________ Movements: ____________ Start time: ____________ Finish time: ____________ °Date: ____________ Movements: ____________ Start time: ____________ Finish time: ____________ °Date: ____________ Movements: ____________ Start time: ____________ Finish time: ____________ °Date: ____________ Movements: ____________ Start time: ____________ Finish time: ____________ °Date: ____________ Movements: ____________ Start time: ____________ Finish time: ____________ °Date: ____________ Movements: ____________ Start time: ____________ Finish time: ____________  °Date: ____________ Movements: ____________ Start time: ____________ Finish time: ____________ °Date: ____________ Movements: ____________ Start time: ____________ Finish time: ____________ °Date: ____________ Movements: ____________ Start time: ____________ Finish time: ____________ °Date: ____________ Movements: ____________ Start time: ____________ Finish time: ____________ °Date: ____________ Movements: ____________ Start time: ____________ Finish time: ____________ °Date: ____________ Movements: ____________ Start time: ____________ Finish time: ____________ °Date: ____________ Movements: ____________ Start time: ____________ Finish time: ____________  °Date: ____________ Movements: ____________ Start time: ____________ Finish time: ____________ °Date: ____________ Movements: ____________ Start time: ____________ Finish  time: ____________ °Date: ____________ Movements: ____________ Start time: ____________ Finish time: ____________ °Date: ____________ Movements: ____________ Start time: ____________ Finish time: ____________ °Date: ____________ Movements: ____________ Start time: ____________ Finish time: ____________ °Date: ____________ Movements: ____________ Start time: ____________ Finish time: ____________ °Date: ____________ Movements: ____________ Start time: ____________ Finish time: ____________  °Date: ____________ Movements: ____________ Start time: ____________ Finish   time: ____________ °Date: ____________ Movements: ____________ Start time: ____________ Finish time: ____________ °Date: ____________ Movements: ____________ Start time: ____________ Finish time: ____________ °Date: ____________ Movements: ____________ Start time: ____________ Finish time: ____________ °Date: ____________ Movements: ____________ Start time: ____________ Finish time: ____________ °Date: ____________ Movements: ____________ Start time: ____________ Finish time: ____________ °Date: ____________ Movements: ____________ Start time: ____________ Finish time: ____________  °Date: ____________ Movements: ____________ Start time: ____________ Finish time: ____________ °Date: ____________ Movements: ____________ Start time: ____________ Finish time: ____________ °Date: ____________ Movements: ____________ Start time: ____________ Finish time: ____________ °Date: ____________ Movements: ____________ Start time: ____________ Finish time: ____________ °Date: ____________ Movements: ____________ Start time: ____________ Finish time: ____________ °Date: ____________ Movements: ____________ Start time: ____________ Finish time: ____________ °Date: ____________ Movements: ____________ Start time: ____________ Finish time: ____________  °Date: ____________ Movements: ____________ Start time: ____________ Finish time: ____________ °Date: ____________  Movements: ____________ Start time: ____________ Finish time: ____________ °Date: ____________ Movements: ____________ Start time: ____________ Finish time: ____________ °Date: ____________ Movements: ____________ Start time: ____________ Finish time: ____________ °Date: ____________ Movements: ____________ Start time: ____________ Finish time: ____________ °Date: ____________ Movements: ____________ Start time: ____________ Finish time: ____________ °Date: ____________ Movements: ____________ Start time: ____________ Finish time: ____________  °Date: ____________ Movements: ____________ Start time: ____________ Finish time: ____________ °Date: ____________ Movements: ____________ Start time: ____________ Finish time: ____________ °Date: ____________ Movements: ____________ Start time: ____________ Finish time: ____________ °Date: ____________ Movements: ____________ Start time: ____________ Finish time: ____________ °Date: ____________ Movements: ____________ Start time: ____________ Finish time: ____________ °Date: ____________ Movements: ____________ Start time: ____________ Finish time: ____________ °Document Released: 02/01/2006 Document Revised: 05/19/2013 Document Reviewed: 10/30/2011 °[employer]® Patient Information ©2015 [employer]. This information is not intended to replace advice given to you by your health care provider. Make sure you discuss any questions you have with your health care provider. °Vaginal Delivery °During delivery, your health care provider will help you give birth to your baby. During a vaginal delivery, you will work to push the baby out of your vagina. However, before you can push your baby out, a few things need to happen. The opening of your uterus (cervix) has to soften, thin out, and open up (dilate) all the way to 10 cm. Also, your baby has to move down from the uterus into your vagina.  °SIGNS OF LABOR  °Your health care provider will first need to make sure you are in labor.  Signs of labor include:  °· Passing what is called the mucous plug before labor begins. This is a small amount of blood-stained mucus. °· Having regular, painful uterine contractions.   °· The time between contractions gets shorter.   °· The discomfort and pain gradually get more intense. °· Contraction pains get worse when walking and do not go away when resting.   °· Your cervix becomes thinner (effacement) and dilates. °BEFORE THE DELIVERY °Once you are in labor and admitted into the hospital or care center, your health care provider may do the following:  °5. Perform a complete physical exam. °6. Review any complications related to pregnancy or labor.  °7. Check your blood pressure, pulse, temperature, and heart rate (vital signs).   °8. Determine if, and when, the rupture of amniotic membranes occurred. °9. Do a vaginal exam (using a sterile glove and lubricant) to determine:   °1. The position (presentation) of the baby. Is the baby's head presenting first (vertex) in the birth canal (vagina), or are the feet or buttocks first (breech)?   °2. The level (station) of the baby's head within the birth canal.   °3.   The effacement and dilatation of the cervix.   °10. An electronic fetal monitor is usually placed on your abdomen when you first arrive. This is used to monitor your contractions and the baby's heart rate. °1. When the monitor is on your abdomen (external fetal monitor), it can only pick up the frequency and length of your contractions. It cannot tell the strength of your contractions. °2. If it becomes necessary for your health care provider to know exactly how strong your contractions are or to see exactly what the baby's heart rate is doing, an internal monitor may be inserted into your vagina and uterus. Your health care provider will discuss the benefits and risks of using an internal monitor and obtain your permission before inserting the device. °3. Continuous fetal monitoring may be needed if you  have an epidural, are receiving certain medicines (such as oxytocin), or have pregnancy or labor complications. °11. An IV access tube may be placed into a vein in your arm to deliver fluids and medicines if necessary. °THREE STAGES OF LABOR AND DELIVERY °Normal labor and delivery is divided into three stages. °First Stage °This stage starts when you begin to contract regularly and your cervix begins to efface and dilate. It ends when your cervix is completely open (fully dilated). The first stage is the longest stage of labor and can last from 3 hours to 15 hours.  °Several methods are available to help with labor pain. You and your health care provider will decide which option is best for you. Options include:  °· Opioid medicines. These are strong pain medicines that you can get through your IV tube or as a shot into your muscle. These medicines lessen pain but do not make it go away completely.  °· Epidural. A medicine is given through a thin tube that is inserted in your back. The medicine numbs the lower part of your body and prevents any pain in that area. °· Paracervical pain medicine. This is an injection of an anesthetic on each side of your cervix.   °· You may request natural childbirth, which does not involve the use of pain medicines or an epidural during labor and delivery. Instead, you will use other things, such as breathing exercises, to help cope with the pain. °Second Stage °The second stage of labor begins when your cervix is fully dilated at 10 cm. It continues until you push your baby down through the birth canal and the baby is born. This stage can take only minutes or several hours. °· The location of your baby's head as it moves through the birth canal is reported as a number called a station. If the baby's head has not started its descent, the station is described as being at minus 3 (-3). When your baby's head is at the zero station, it is at the middle of the birth canal and is engaged  in the pelvis. The station of your baby helps indicate the progress of the second stage of labor. °· When your baby is born, your health care provider may hold the baby with his or her head lowered to prevent amniotic fluid, mucus, and blood from getting into the baby's lungs. The baby's mouth and nose may be suctioned with a small bulb syringe to remove any additional fluid. °· Your health care provider may then place the baby on your stomach. It is important to keep the baby from getting cold. To do this, the health care provider will dry the baby off, place the   baby directly on your skin (with no blankets between you and the baby), and cover the baby with warm, dry blankets.   °· The umbilical cord is cut. °Third Stage °During the third stage of labor, your health care provider will deliver the placenta (afterbirth) and make sure your bleeding is under control. The delivery of the placenta usually takes about 5 minutes but can take up to 30 minutes. After the placenta is delivered, a medicine may be given either by IV or injection to help contract the uterus and control bleeding. If you are planning to breastfeed, you can try to do so now. °After you deliver the placenta, your uterus should contract and get very firm. If your uterus does not remain firm, your health care provider will massage it. This is important because the contraction of the uterus helps cut off bleeding at the site where the placenta was attached to your uterus. If your uterus does not contract properly and stay firm, you may continue to bleed heavily. If there is a lot of bleeding, medicines may be given to contract the uterus and stop the bleeding.  °Document Released: 10/12/2007 Document Revised: 05/19/2013 Document Reviewed: 06/23/2012 °Exit[employer]® Patient Information ©2015 [employer]. This information is not intended to replace advice given to you by your health care provider. Make sure you discuss any questions you have with your  health care provider. ° °

## 2014-07-01 NOTE — MAU Note (Signed)
Ctx q 4-5, denies LOF or VB.

## 2014-07-01 NOTE — MAU Note (Signed)
Contractions every 4-5 minutes since 9 pm. Denies LOF or vaginal bleeding. Denies complications with pregnancy. Positive fetal movement.

## 2014-07-01 NOTE — MAU Note (Signed)
Contractions started during the night, are now q .  Had some bloody mucous this morning, no leaking. Was 2 cm when last checked.

## 2014-07-02 ENCOUNTER — Inpatient Hospital Stay (HOSPITAL_COMMUNITY): Payer: Medicaid Other | Admitting: Anesthesiology

## 2014-07-02 ENCOUNTER — Encounter (HOSPITAL_COMMUNITY): Payer: Self-pay | Admitting: *Deleted

## 2014-07-02 DIAGNOSIS — K219 Gastro-esophageal reflux disease without esophagitis: Secondary | ICD-10-CM | POA: Diagnosis present

## 2014-07-02 DIAGNOSIS — O9962 Diseases of the digestive system complicating childbirth: Secondary | ICD-10-CM | POA: Diagnosis present

## 2014-07-02 DIAGNOSIS — Z8249 Family history of ischemic heart disease and other diseases of the circulatory system: Secondary | ICD-10-CM | POA: Diagnosis not present

## 2014-07-02 DIAGNOSIS — Z3A39 39 weeks gestation of pregnancy: Secondary | ICD-10-CM | POA: Diagnosis present

## 2014-07-02 DIAGNOSIS — Z6831 Body mass index (BMI) 31.0-31.9, adult: Secondary | ICD-10-CM | POA: Diagnosis not present

## 2014-07-02 DIAGNOSIS — O99824 Streptococcus B carrier state complicating childbirth: Secondary | ICD-10-CM | POA: Diagnosis not present

## 2014-07-02 DIAGNOSIS — Z833 Family history of diabetes mellitus: Secondary | ICD-10-CM | POA: Diagnosis not present

## 2014-07-02 DIAGNOSIS — IMO0001 Reserved for inherently not codable concepts without codable children: Secondary | ICD-10-CM

## 2014-07-02 DIAGNOSIS — O99214 Obesity complicating childbirth: Secondary | ICD-10-CM | POA: Diagnosis present

## 2014-07-02 DIAGNOSIS — E669 Obesity, unspecified: Secondary | ICD-10-CM | POA: Diagnosis present

## 2014-07-02 DIAGNOSIS — Z825 Family history of asthma and other chronic lower respiratory diseases: Secondary | ICD-10-CM | POA: Diagnosis not present

## 2014-07-02 LAB — RPR: RPR Ser Ql: NONREACTIVE

## 2014-07-02 LAB — CBC
HCT: 41.7 % (ref 36.0–46.0)
Hemoglobin: 14.9 g/dL (ref 12.0–15.0)
MCH: 30.1 pg (ref 26.0–34.0)
MCHC: 35.7 g/dL (ref 30.0–36.0)
MCV: 84.2 fL (ref 78.0–100.0)
PLATELETS: 243 10*3/uL (ref 150–400)
RBC: 4.95 MIL/uL (ref 3.87–5.11)
RDW: 13.8 % (ref 11.5–15.5)
WBC: 12.1 10*3/uL — AB (ref 4.0–10.5)

## 2014-07-02 LAB — TYPE AND SCREEN
ABO/RH(D): B POS
Antibody Screen: NEGATIVE

## 2014-07-02 LAB — ABO/RH: ABO/RH(D): B POS

## 2014-07-02 MED ORDER — DIPHENHYDRAMINE HCL 50 MG/ML IJ SOLN: 12.5000 mg | INTRAMUSCULAR | Status: DC | PRN

## 2014-07-02 MED ORDER — SENNOSIDES-DOCUSATE SODIUM 8.6-50 MG PO TABS
2.0000 | ORAL_TABLET | ORAL | Status: DC
  Administered 2014-07-03 (×2): 2 via ORAL
  Filled 2014-07-02 (×2): qty 2

## 2014-07-02 MED ORDER — BENZOCAINE-MENTHOL 20-0.5 % EX AERO
1.0000 "application " | INHALATION_SPRAY | CUTANEOUS | Status: DC | PRN
  Filled 2014-07-02 (×2): qty 56

## 2014-07-02 MED ORDER — OXYCODONE-ACETAMINOPHEN 5-325 MG PO TABS: 2.0000 | ORAL_TABLET | ORAL | Status: DC | PRN

## 2014-07-02 MED ORDER — PRENATAL MULTIVITAMIN CH
1.0000 | ORAL_TABLET | Freq: Every day | ORAL | Status: DC
  Administered 2014-07-03 – 2014-07-04 (×2): 1 via ORAL
  Filled 2014-07-02 (×2): qty 1

## 2014-07-02 MED ORDER — FENTANYL 2.5 MCG/ML BUPIVACAINE 1/10 % EPIDURAL INFUSION (WH - ANES): 12.0000 mL/h | INTRAMUSCULAR | Status: DC | PRN

## 2014-07-02 MED ORDER — DIBUCAINE 1 % RE OINT: 1.0000 "application " | TOPICAL_OINTMENT | RECTAL | Status: DC | PRN

## 2014-07-02 MED ORDER — FLEET ENEMA 7-19 GM/118ML RE ENEM: 1.0000 | ENEMA | Freq: Every day | RECTAL | Status: DC | PRN

## 2014-07-02 MED ORDER — PENICILLIN G POTASSIUM 5000000 UNITS IJ SOLR
5.0000 10*6.[IU] | Freq: Once | INTRAVENOUS | Status: AC
  Administered 2014-07-02: 5 10*6.[IU] via INTRAVENOUS
  Filled 2014-07-02: qty 5

## 2014-07-02 MED ORDER — PHENYLEPHRINE 40 MCG/ML (10ML) SYRINGE FOR IV PUSH (FOR BLOOD PRESSURE SUPPORT)
80.0000 ug | PREFILLED_SYRINGE | INTRAVENOUS | Status: DC | PRN
  Filled 2014-07-02: qty 20

## 2014-07-02 MED ORDER — ONDANSETRON HCL 4 MG/2ML IJ SOLN: 4.0000 mg | INTRAMUSCULAR | Status: DC | PRN

## 2014-07-02 MED ORDER — DIPHENHYDRAMINE HCL 25 MG PO CAPS: 25.0000 mg | ORAL_CAPSULE | Freq: Four times a day (QID) | ORAL | Status: DC | PRN

## 2014-07-02 MED ORDER — OXYTOCIN 40 UNITS IN LACTATED RINGERS INFUSION - SIMPLE MED: 62.5000 mL/h | INTRAVENOUS | Status: DC | PRN

## 2014-07-02 MED ORDER — WITCH HAZEL-GLYCERIN EX PADS: 1.0000 "application " | MEDICATED_PAD | CUTANEOUS | Status: DC | PRN

## 2014-07-02 MED ORDER — ACETAMINOPHEN 325 MG PO TABS
650.0000 mg | ORAL_TABLET | ORAL | Status: DC | PRN
  Administered 2014-07-02 – 2014-07-03 (×3): 650 mg via ORAL
  Filled 2014-07-02 (×3): qty 2

## 2014-07-02 MED ORDER — LANOLIN HYDROUS EX OINT: TOPICAL_OINTMENT | CUTANEOUS | Status: DC | PRN

## 2014-07-02 MED ORDER — PENICILLIN G POTASSIUM 5000000 UNITS IJ SOLR
2.5000 10*6.[IU] | INTRAVENOUS | Status: DC
  Administered 2014-07-02 (×3): 2.5 10*6.[IU] via INTRAVENOUS
  Filled 2014-07-02 (×6): qty 2.5

## 2014-07-02 MED ORDER — TERBUTALINE SULFATE 1 MG/ML IJ SOLN: 0.2500 mg | Freq: Once | INTRAMUSCULAR | Status: DC | PRN

## 2014-07-02 MED ORDER — FENTANYL CITRATE (PF) 100 MCG/2ML IJ SOLN
100.0000 ug | Freq: Once | INTRAMUSCULAR | Status: AC
  Administered 2014-07-02: 100 ug via INTRAVENOUS
  Filled 2014-07-02: qty 2

## 2014-07-02 MED ORDER — LIDOCAINE HCL (PF) 1 % IJ SOLN
30.0000 mL | INTRAMUSCULAR | Status: DC | PRN
  Filled 2014-07-02: qty 30

## 2014-07-02 MED ORDER — LIDOCAINE HCL (PF) 1 % IJ SOLN
INTRAMUSCULAR | Status: DC | PRN
  Administered 2014-07-02: 4 mL
  Administered 2014-07-02: 3 mL

## 2014-07-02 MED ORDER — ONDANSETRON HCL 4 MG PO TABS: 4.0000 mg | ORAL_TABLET | ORAL | Status: DC | PRN

## 2014-07-02 MED ORDER — OXYTOCIN 40 UNITS IN LACTATED RINGERS INFUSION - SIMPLE MED
1.0000 m[IU]/min | INTRAVENOUS | Status: DC
  Administered 2014-07-02: 2 m[IU]/min via INTRAVENOUS

## 2014-07-02 MED ORDER — EPHEDRINE 5 MG/ML INJ: 10.0000 mg | INTRAVENOUS | Status: DC | PRN

## 2014-07-02 MED ORDER — SODIUM CHLORIDE 0.9 % IV SOLN: 250.0000 mL | INTRAVENOUS | Status: DC | PRN

## 2014-07-02 MED ORDER — CITRIC ACID-SODIUM CITRATE 334-500 MG/5ML PO SOLN: 30.0000 mL | ORAL | Status: DC | PRN

## 2014-07-02 MED ORDER — PHENYLEPHRINE 40 MCG/ML (10ML) SYRINGE FOR IV PUSH (FOR BLOOD PRESSURE SUPPORT): 80.0000 ug | PREFILLED_SYRINGE | INTRAVENOUS | Status: DC | PRN

## 2014-07-02 MED ORDER — BISACODYL 10 MG RE SUPP
10.0000 mg | Freq: Every day | RECTAL | Status: DC | PRN
  Filled 2014-07-02: qty 1

## 2014-07-02 MED ORDER — SODIUM CHLORIDE 0.9 % IJ SOLN: 3.0000 mL | INTRAMUSCULAR | Status: DC | PRN

## 2014-07-02 MED ORDER — ONDANSETRON HCL 4 MG/2ML IJ SOLN: 4.0000 mg | Freq: Four times a day (QID) | INTRAMUSCULAR | Status: DC | PRN

## 2014-07-02 MED ORDER — OXYCODONE-ACETAMINOPHEN 5-325 MG PO TABS: 1.0000 | ORAL_TABLET | ORAL | Status: DC | PRN

## 2014-07-02 MED ORDER — ACETAMINOPHEN 325 MG PO TABS: 650.0000 mg | ORAL_TABLET | ORAL | Status: DC | PRN

## 2014-07-02 MED ORDER — SODIUM CHLORIDE 0.9 % IJ SOLN: 3.0000 mL | Freq: Two times a day (BID) | INTRAMUSCULAR | Status: DC

## 2014-07-02 MED ORDER — LACTATED RINGERS IV SOLN
INTRAVENOUS | Status: DC
  Administered 2014-07-02: 10:00:00 via INTRAVENOUS

## 2014-07-02 MED ORDER — SIMETHICONE 80 MG PO CHEW: 80.0000 mg | CHEWABLE_TABLET | ORAL | Status: DC | PRN

## 2014-07-02 MED ORDER — FENTANYL 2.5 MCG/ML BUPIVACAINE 1/10 % EPIDURAL INFUSION (WH - ANES)
14.0000 mL/h | INTRAMUSCULAR | Status: DC | PRN
  Administered 2014-07-02: 14 mL/h via EPIDURAL
  Administered 2014-07-02: 12 mL/h via EPIDURAL
  Administered 2014-07-02: 14 mL/h via EPIDURAL
  Filled 2014-07-02 (×2): qty 125

## 2014-07-02 MED ORDER — OXYTOCIN BOLUS FROM INFUSION
500.0000 mL | INTRAVENOUS | Status: DC
  Administered 2014-07-02: 500 mL via INTRAVENOUS

## 2014-07-02 MED ORDER — OXYTOCIN 40 UNITS IN LACTATED RINGERS INFUSION - SIMPLE MED
62.5000 mL/h | INTRAVENOUS | Status: DC
  Administered 2014-07-02: 62.5 mL/h via INTRAVENOUS
  Filled 2014-07-02: qty 1000

## 2014-07-02 MED ORDER — ZOLPIDEM TARTRATE 5 MG PO TABS: 5.0000 mg | ORAL_TABLET | Freq: Every evening | ORAL | Status: DC | PRN

## 2014-07-02 MED ORDER — LACTATED RINGERS IV SOLN: 500.0000 mL | INTRAVENOUS | Status: DC | PRN

## 2014-07-02 MED ORDER — IBUPROFEN 600 MG PO TABS
600.0000 mg | ORAL_TABLET | Freq: Four times a day (QID) | ORAL | Status: DC
  Administered 2014-07-02 – 2014-07-04 (×7): 600 mg via ORAL
  Filled 2014-07-02 (×8): qty 1

## 2014-07-02 NOTE — H&P (Signed)
LABOR ADMISSION HISTORY AND PHYSICAL  Autumn Crawford is a 24 y.o. female G3P0020 with IUP at [redacted]w[redacted]d by LMP presenting for active labor. She starting having contractions about one hour ago. They are getting more intense and more painful. She reports +FMs, No LOF, no VB, no blurry vision, headaches or peripheral edema, and RUQ pain.  She plans on breast feeding. She is undecided on what she will use for birth control.  Dating: By LMP consistent with 8 week ultrasound --->  Estimated Date of Delivery: 07/07/14  Sono:    , CWD, normal anatomy, 870g, 78% EFW   Prenatal History/Complications: Clinic [facility] Prenatal Labs  Dating LMP + 8 wk u/s Blood type: B/POS/-- (11/11 1505)  Genetic Screen 1 Screen: Normal EAV:[id number]  Antibody:NEG (11/11 1505)  Anatomic [place] Normal Rubella: 1.89 (11/11 1505)  GTT Third trimester: 98 RPR: NON REAC (11/11 1505)   TDaP vaccine 04/13/14 HBsAg: NEGATIVE (11/11 1505)   Flu vaccine 12/30/13 HIV: NONREACTIVE (11/11 1505)   GBS pos GBS: pos  Contraception Nuvaring vs OCPs Pap: Normal pap in 04/10/13  Baby Food Breast   Circumcision Yes .  Pediatrician Undecided   Support Person Mother       Past Medical History: Past Medical History  Diagnosis Date  . Asthma     childhood  . Trichomonas   . Chlamydia     Past Surgical History: Past Surgical History  Procedure Laterality Date  . No past surgeries      Obstetrical History: OB History    Gravida Para Term Preterm AB TAB SAB Ectopic Multiple Living   0      Social History: History   Social History  . Marital Status: Single    Spouse Name: N/A  . Number of Children: N/A  . Years of Education: N/A   Social History Main Topics  . Smoking status: Never Smoker   . Smokeless tobacco: Never Used  . Alcohol Use: No  . Drug Use: No  . Sexual Activity: Yes    Birth Control/ Protection: None   Other Topics Concern  . None   Social  History Narrative    Family History: Family History  Problem Relation Age of Onset  . Fibroids Mother   . Hypertension Maternal Grandmother   . Diabetes Maternal Grandmother   . COPD Maternal Grandmother     bone    Allergies: No Known Allergies  Prescriptions prior to admission  Medication Sig Dispense Refill Last Dose  . calcium carbonate (TUMS - DOSED IN MG ELEMENTAL CALCIUM) 500 MG chewable tablet Chew 1 tablet by mouth at bedtime as needed for indigestion or heartburn.   06/30/2014 at Unknown time  . Prenatal Vit-Fe Fumarate-FA (MULTIVITAMIN-PRENATAL) 27-0.8 MG TABS tablet Take 1 tablet by mouth daily at 12 noon. 30 each 11 06/30/2014 at Unknown time     Review of Systems   All systems reviewed and negative except as stated in HPI  Height  (1.651 m), weight 86.637 kg (191 lb), last menstrual period 09/30/2013. General appearance: alert and cooperative pain with contractions Lungs: clear to auscultation bilaterally Heart: regular rate and rhythm Abdomen: soft, non-tender; bowel sounds normal Extremities: no swelling or calf tenderness Presentation: cephalic Fetal monitoringBaseline: 125 bpm, Variability: Good {> 6 bpm), Accelerations: Reactive and Decelerations: Absent Uterine activity q1-4 minutes  Dilation: 3 Effacement (%): 90 Station: 0 Exam by:: Judeth Horn RN   Prenatal labs: ABO, Rh: B/POS/-- (  11/11 1505) Antibody: NEG (11/11 1505) Rubella:   RPR: NON REAC (03/28 1403)  HBsAg: NEGATIVE (11/11 1505)  HIV: NONREACTIVE (03/28 1403)  GBS: Positive (05/24 0000)  1 hr Glucola normal, 98 Genetic screening  normal Anatomy US normal  Prenatal Transfer Tool  Maternal Diabetes: No Genetic Screening: Normal Maternal Ultrasounds/Referrals: Normal Fetal Ultrasounds or other Referrals:  None Maternal Substance Abuse:  No Significant Maternal Medications:  None Significant Maternal Lab Results: Lab values include: Group B Strep positive  No results found  for this or any previous visit (from the past 24 hour(s)).  Patient Active Problem List   Diagnosis Date Noted  . Group B Streptococcus carrier, +RV culture, currently pregnant 06/12/2014  . Supervision of normal first pregnancy 12/30/2013    Assessment: Autumn Crawford is a 24 y.o. G3P0020 at [redacted]w[redacted]d here for active labor  #Labor: Admit to birthing suite. Monitor for progression of labor. Membranes currently intact #Pain: Patient desires epidural #FWB: Currently cat I  #ID:  GBS positive. Start penicillin #MOF: Breast  #MOC: undecided #Circ:  Yes  Patient history, exam, assessment and plan discussed with Philipp Deputy, CNM.  Fabio Asa 07/02/2014, 1:57 AM   CNM attestation:  I have seen and examined this patient; I agree with above documentation in the resident's note.   Autumn Crawford is a 24 y.o. [id number] here for early labor  PE: BP 130/74 mmHg  Pulse 66  Temp(Src) 98.1 F (36.7 C) (Oral)  Resp 20  Ht 5\' 5"  (1.651 m)  Wt 86.637 kg (191 lb)  BMI 31.78 kg/m2  SpO2 100%  LMP 09/30/2013 Gen: calm comfortable, NAD Resp: normal effort, no distress Abd: gravid  ROS, labs, PMH reviewed  Plan: Admit to [facility] Expectant management Anticipate SVD  Autumn Crawford CNM 07/02/2014, 6:46 AM

## 2014-07-02 NOTE — Progress Notes (Signed)
Patient ID: Autumn Crawford, female   DOB: 19-Dec-1990, 24 y.o.   MRN: [id number] Labor Progress Note  S: resting comfortably after getting epidural. Feeling much better  O:  BP 120/69 mmHg  Pulse 68  Temp(Src) 98.1 F (36.7 C) (Oral)  Resp 20  Ht 5\' 5"  (1.651 m)  Wt 86.637 kg (191 lb)  BMI 31.78 kg/m2  SpO2 100%  LMP 09/30/2013 Cat I. Baseline 125, good variability, + accels. - decels Toco: contractions q1-3 minutes CVE: 6 cm, 90%, -1, mid position AROM with clear fluid   A&P: 24 y.o. [id number] [redacted]w[redacted]d presenting in active labor # Labor. Progressing well.  # Analgesia. Epidural in place # FWB. Cat I  Fabio Asa, MD 4:13 AM

## 2014-07-02 NOTE — Anesthesia Preprocedure Evaluation (Signed)
Anesthesia Evaluation  Patient identified by MRN, date of birth, ID band Patient awake    Reviewed: Allergy & Precautions, Patient's Chart, lab work & pertinent test results  Airway Mallampati: III  TM Distance: >3 FB Neck ROM: Full    Dental no notable dental hx. (+) Teeth Intact   Pulmonary asthma ,  breath sounds clear to auscultation  Pulmonary exam normal       Cardiovascular negative cardio ROS Normal cardiovascular examRhythm:Regular Rate:Normal     Neuro/Psych negative neurological ROS  negative psych ROS   GI/Hepatic Neg liver ROS, GERD-  Medicated and Controlled,  Endo/Other  Obesity   Renal/GU negative Renal ROS  negative genitourinary   Musculoskeletal negative musculoskeletal ROS (+)   Abdominal (+) + obese,   Peds  Hematology   Anesthesia Other Findings   Reproductive/Obstetrics (+) Pregnancy                             Anesthesia Physical Anesthesia Plan  ASA: II  Anesthesia Plan: Epidural   Post-op Pain Management:    Induction:   Airway Management Planned: Natural Airway  Additional Equipment:   Intra-op Plan:   Post-operative Plan:   Informed Consent: I have reviewed the patients History and Physical, chart, labs and discussed the procedure including the risks, benefits and alternatives for the proposed anesthesia with the patient or authorized representative who has indicated his/her understanding and acceptance.     Plan Discussed with: Anesthesiologist  Anesthesia Plan Comments:         Anesthesia Quick Evaluation

## 2014-07-02 NOTE — Progress Notes (Signed)
Patient ID: Autumn Crawford, female   DOB: [birth year], 24 y.o.   MRN: [id number] Labor Progress Note  S: resting comfortably. Feeling regular movement   O:  BP 130/74 mmHg  Pulse 66  Temp(Src) 98.1 F (36.7 C) (Oral)  Resp 20  Ht 5\' 5"  (1.651 m)  Wt 86.637 kg (191 lb)  BMI 31.78 kg/m2  SpO2 100%  LMP 09/30/2013 Cat I. Baseline 130, good variability, + accels. - decels Toco: contractions q2-4 minutes CVE: 7.5, 90%, -1, midposition   A&P: 24 y.o. [id number] [redacted]w[redacted]d presenting in active labor # Labor. Progressing well # GBS. Due for second dose of pitocin.  # Analgesia. Epidural in place # FWB. Cat I  Fabio Asa, MD 6:56 AM

## 2014-07-02 NOTE — Anesthesia Procedure Notes (Addendum)
Epidural Patient location during procedure: OB Start time: 07/02/2014 3:29 AM  Staffing Anesthesiologist: Mal Amabile Performed by: anesthesiologist   Preanesthetic Checklist Completed: patient identified, site marked, surgical consent, pre-op evaluation, timeout performed, IV checked, risks and benefits discussed and monitors and equipment checked  Epidural Patient position: sitting Prep: site prepped and draped and DuraPrep Patient monitoring: continuous pulse ox and blood pressure Approach: midline Location: L3-L4 Injection technique: LOR air  Needle:  Needle type: Tuohy  Needle gauge: 17 G Needle length: 9 cm and 9 Needle insertion depth: 5 cm cm Catheter type: closed end flexible Catheter size: 19 Gauge Catheter at skin depth: 10 cm Test dose: negative and Other  Assessment Events: blood not aspirated, injection not painful, no injection resistance, negative IV test and no paresthesia  Additional Notes Patient identified. Risks and benefits discussed including failed block, incomplete  Pain control, post dural puncture headache, nerve damage, paralysis, blood pressure Changes, nausea, vomiting, reactions to medications-both toxic and allergic and post Partum back pain. All questions were answered. Patient expressed understanding and wished to proceed. Sterile technique was used throughout procedure. Epidural site was Dressed with sterile barrier dressing. No paresthesias, signs of intravascular injection Or signs of intrathecal spread were encountered.  Patient was more comfortable after the epidural was dosed. Please see RN's note for documentation of vital signs and FHR which are stable.

## 2014-07-03 NOTE — Progress Notes (Signed)
Post Partum Day 1  Subjective:  Autumn Crawford is a 24 y.o. [id number] [redacted]w[redacted]d s/p VAVD.  No acute events overnight.  Pt denies problems with ambulating, voiding or po intake.  She denies nausea or vomiting.  Pain is moderately controlled.  She has not had flatus. She has not had bowel movement.  Lochia Small.  Plan for birth control is oral contraceptives (estrogen/progesterone) but still undecided.  Method of Feeding: Breast and Bottle  Objective: BP 115/60 mmHg  Pulse 64  Temp(Src) 97.8 F (36.6 C) (Oral)  Resp 16  Ht 5\' 5"  (1.651 m)  Wt 191 lb (86.637 kg)  BMI 31.78 kg/m2  SpO2 99%  LMP 09/30/2013  Breastfeeding? Unknown  Physical Exam:  General: alert, cooperative and no distress Lochia:normal flow Chest: CTAB Heart: RRR no m/r/g Abdomen: +BS, soft, nontender Uterine Fundus: firm DVT Evaluation: No evidence of DVT seen on physical exam. Extremities: no edema   Recent Labs  07/02/14 0145  HGB 14.9  HCT 41.7    Assessment/Plan:  ASSESSMENT: Autumn Crawford is a 24 y.o. E0P2330 [redacted]w[redacted]d ppd #1 s/p VAVD doing well.   Plan for discharge tomorrow, Breastfeeding and Lactation consult  Circ most likely outpatient due to insurance. Will follow-up with [facility].    LOS: 1 day   Caryl Ada, DO 07/03/2014, 7:43 AM PGY-1, [facility]   CNM attestation Post Partum Day #1 I have seen and examined this patient and agree with above documentation in the resident's note.   Autumn Crawford is a 24 y.o. [id number] s/p VAVD.  Pt denies problems with ambulating, voiding or po intake. Pain is well controlled.  Plan for birth control is considering oral contraceptives (estrogen/progesterone).  Method of Feeding: br/bo  PE:  BP 115/60 mmHg  Pulse 64  Temp(Src) 97.8 F (36.6 C) (Oral)  Resp 16  Ht 5\' 5"  (1.651 m)  Wt 86.637 kg (191 lb)  BMI 31.78 kg/m2  SpO2 99%  LMP 09/30/2013  Breastfeeding? Unknown Fundus firm  Plan for discharge: 07/04/14  Cam Hai,  CNM 10:05 AM

## 2014-07-03 NOTE — Lactation Note (Signed)
This note was copied from the chart of Boy Neharika Barboza. Lactation Consultation Note: Initial visit with mom, baby now 14 hours old. Mom reports baby was latching to tip of nipple and it was hurting. Has given a couple of bottles of formula. Reviewed wide open mouth and keeping the baby close to the breast throughout the feeding. States she will keep trying to breast feed. Encouraged to call RN or LC for assist. BF brochure given with resources for support after DC. No questions at present. To call prn  Patient Name: Boy Chareese Veth [id number] Date: 07/03/2014 Reason for consult: Initial assessment   Maternal Data Formula Feeding for Exclusion: Yes Reason for exclusion: Mother's choice to formula and breast feed on admission Has patient been taught Hand Expression?: Yes Does the patient have breastfeeding experience prior to this delivery?: No  Feeding    LATCH Score/Interventions                      Lactation Tools Discussed/Used     Consult Status Consult Status: Follow-up Date: 07/04/14 Follow-up type: In-patient    Pamelia Hoit 07/03/2014, 3:30 PM

## 2014-07-03 NOTE — Anesthesia Postprocedure Evaluation (Signed)
Anesthesia Post Note  Patient: Autumn Crawford  Procedure(s) Performed: * No procedures listed *  Anesthesia type: Epidural  Patient location: Mother/Baby  Post pain: Pain level controlled  Post assessment: Post-op Vital signs reviewed  Last Vitals:  Filed Vitals:   07/03/14 0100  BP: 115/60  Pulse: 64  Temp: 36.6 C  Resp: 16    Post vital signs: Reviewed  Level of consciousness: awake  Complications: No apparent anesthesia complications

## 2014-07-04 MED ORDER — IBUPROFEN 600 MG PO TABS: 600.0000 mg | ORAL_TABLET | Freq: Four times a day (QID) | ORAL | Status: AC | PRN

## 2014-07-04 NOTE — Discharge Summary (Signed)
Obstetric Discharge Summary Reason for Admission: onset of labor Prenatal Procedures: none Intrapartum Procedures: vacuum and GBS prophylaxis Postpartum Procedures: none Complications-Operative and Postpartum: 1st degree perineal laceration HEMOGLOBIN  Date Value Ref Range Status  07/02/2014 14.9 12.0 - 15.0 g/dL Final   HCT  Date Value Ref Range Status  07/02/2014 41.7 36.0 - 46.0 % Final   Autumn Crawford is a 23yo G3P0020 @ 39.2wks in early labor. She progressed to complete later that same day, and then began to have FHR variables w/ pushing requiring vacuum assistance for delivery. By PPD#2 she is doing well and is ready for discharge home. She is breastfeeding and desires OCPs for contraception.  Physical Exam:  General: alert, cooperative and no distress  Heart: RRR Lungs: nl effort Lochia: appropriate Uterine Fundus: firm DVT Evaluation: No evidence of DVT seen on physical exam.  Discharge Diagnoses: Term Pregnancy-delivered  Discharge Information: Date: 07/04/2014 Activity: pelvic rest Diet: routine Medications: PNV and Ibuprofen Condition: stable Instructions: refer to practice specific booklet Discharge to: home Follow-up Information    [facility] with [facility]. Schedule an appointment as soon as possible for a visit in 4 weeks.   Specialty:  Obstetrics and Gynecology   Why:  For your postpartum appointment.   Contact information:   [address] [place] [address] [phone]      Newborn Data: Live born female  Birth Weight: 7 lb 8.8 oz (3425 g) APGAR: 8, 9  Home with mother.  Cam Hai CNM 07/04/2014, 8:41 AM

## 2014-07-04 NOTE — Lactation Note (Signed)
This note was copied from the chart of Autumn Deici Patient. Lactation Consultation Note  Mother called for assistance w/ latching. Assisted w/ football hold and breast compression. Sucks and a few swallows observed.  Mother felt pull and tug. Encouraged mother.  Reinforced basics.  Patient Name: Autumn Crawford [id number] Date: 07/04/2014 Reason for consult: Follow-up assessment   Maternal Data    Feeding Feeding Type: Breast Fed  LATCH Score/Interventions Latch: Grasps breast easily, tongue down, lips flanged, rhythmical sucking.  Audible Swallowing: A few with stimulation Intervention(s): Hand expression  Type of Nipple: Flat Intervention(s): Hand pump  Comfort (Breast/Nipple): Soft / non-tender     Hold (Positioning): Assistance needed to correctly position infant at breast and maintain latch.  LATCH Score: 7  Lactation Tools Discussed/Used     Consult Status Consult Status: Complete    Hardie Pulley 07/04/2014, 12:02 PM

## 2014-07-04 NOTE — Lactation Note (Signed)
This note was copied from the chart of Autumn Crawford. Lactation Consultation Note  Mother has had difficulty latching baby.  Has used NS. Reviewed hand expression and prepumping to help evert nipples. Discussed supply and demand, engorgement care and monitoring voids/stools. Encouraged mother to compress breast when latching. Provided volume guidelines for formula after breastfeeding. Suggest she call if she wants further assistance w/ latching. Provided comfort gels for nipple soreness.  Patient Name: Autumn Unborn Carreon [id number] Date: 07/04/2014 Reason for consult: Follow-up assessment   Maternal Data    Feeding Feeding Type: Formula  LATCH Score/Interventions                      Lactation Tools Discussed/Used     Consult Status Consult Status: Complete    Hardie Pulley 07/04/2014, 11:30 AM

## 2014-07-04 NOTE — Discharge Instructions (Signed)

## 2014-07-08 ENCOUNTER — Encounter: Payer: Medicaid Other | Admitting: Certified Nurse Midwife

## 2014-08-10 ENCOUNTER — Encounter: Payer: Self-pay | Admitting: Obstetrics & Gynecology

## 2014-08-12 ENCOUNTER — Encounter: Payer: Self-pay | Admitting: Certified Nurse Midwife

## 2014-08-12 ENCOUNTER — Ambulatory Visit (INDEPENDENT_AMBULATORY_CARE_PROVIDER_SITE_OTHER): Payer: Medicaid Other | Admitting: Certified Nurse Midwife

## 2014-08-12 DIAGNOSIS — Z30011 Encounter for initial prescription of contraceptive pills: Secondary | ICD-10-CM

## 2014-08-12 DIAGNOSIS — Z309 Encounter for contraceptive management, unspecified: Secondary | ICD-10-CM | POA: Insufficient documentation

## 2014-08-12 MED ORDER — NORGESTIM-ETH ESTRAD TRIPHASIC 0.18/0.215/0.25 MG-25 MCG PO TABS: 1.0000 | ORAL_TABLET | Freq: Every day | ORAL | Status: AC

## 2014-08-12 NOTE — Progress Notes (Signed)
  Subjective:     Autumn Crawford is a 24 y.o. female who presents for a postpartum visit. She is 6 weeks postpartum following a vacumn  delivery. I have fully reviewed the prenatal and intrapartum course. The delivery was at 39  gestational weeks. Outcome: vacuum, low. Anesthesia: epidural. Postpartum course has been uneventful. Baby's course has been uneventful. Baby is feeding by bottle - Similac Sensitive RS. Bleeding has had a period. Bowel function is normal. Bladder function is normal. Patient is not sexually active. Contraception method is none. Postpartum depression screening: negative.  The following portions of the patient's history were reviewed and updated as appropriate: allergies, current medications, past family history, past medical history, past social history, past surgical history and problem list.  Review of Systems A comprehensive review of systems was negative.   Objective:    BP 123/71 mmHg  Pulse 60  Resp 16  Ht 5' 5.5" (1.664 m)  Wt 169 lb (76.658 kg)  BMI 27.69 kg/m2  LMP 08/05/2014  Breastfeeding? No  General:  alert and cooperative   Breasts:  inspection negative, no nipple discharge or bleeding, no masses or nodularity palpable  Lungs: clear to auscultation bilaterally  Heart:  normal apical impulse  Abdomen: soft, non-tender; bowel sounds normal; no masses,  no organomegaly   Vulva:  normal  Vagina: normal vagina  Cervix:    Corpus: not examined  Adnexa:  not evaluated  Rectal Exam: Not performed.        Assessment:     6 weeks postpartum exam. Pap smear not done at today's visit.   Plan:    1. Contraception: OCP (estrogen/progesterone)  3. Follow up in: 3 months or as needed.  Subjective:     Autumn Crawford is a 24 y.o. female who presents for a postpartum visit. She is 6 weeks postpartum following a vacumn delivery. I have fully reviewed the prenatal and intrapartum course. The delivery was at 39  gestational weeks. Outcome: vacuum, low.  Anesthesia: epidural. Postpartum course has been uneventful. Baby's course has been uneventful. Baby is feeding by bottle - Similac Sensitive RS. Bleeding has had one period. Bowel function is normal. Bladder function is normal. Patient is not sexually active. Contraception method is OCP (estrogen/progesterone). Postpartum depression screening: negative.  The following portions of the patient's history were reviewed and updated as appropriate: problem list.  Review of Systems Pertinent items are noted in HPI.   Objective:    BP 123/71 mmHg  Pulse 60  Resp 16  Ht 5' 5.5" (1.664 m)  Wt 169 lb (76.658 kg)  BMI 27.69 kg/m2  LMP 08/05/2014  Breastfeeding? No  General:  alert and cooperative   Breasts:  inspection negative, no nipple discharge or bleeding, no masses or nodularity palpable  Lungs: clear to auscultation bilaterally  Heart:  normal apical impulse  Abdomen: soft, non-tender; bowel sounds normal; no masses,  no organomegaly   Vulva:  normal  Vagina: normal vagina  Cervix:    Corpus: not examined  Adnexa:  not evaluated  Rectal Exam: Not performed.        Assessment:     6 week postpartum exam.   Plan:    1. Contraception: OCP (estrogen/progesterone) 2.  3. Follow up in: 3 months for well woman exam or as needed.

## 2014-08-12 NOTE — Patient Instructions (Signed)

## 2016-03-29 IMAGING — US US OB NUCHAL TRANSLUCENCY 1ST GEST
1 series · 13 of 28 positions shown · non-contrast
Comparison: none

[Series 1: us ob nuchal translucency 1st gest · 0.08mm/px · 13 of 30 slices shown]
[im 2/30]
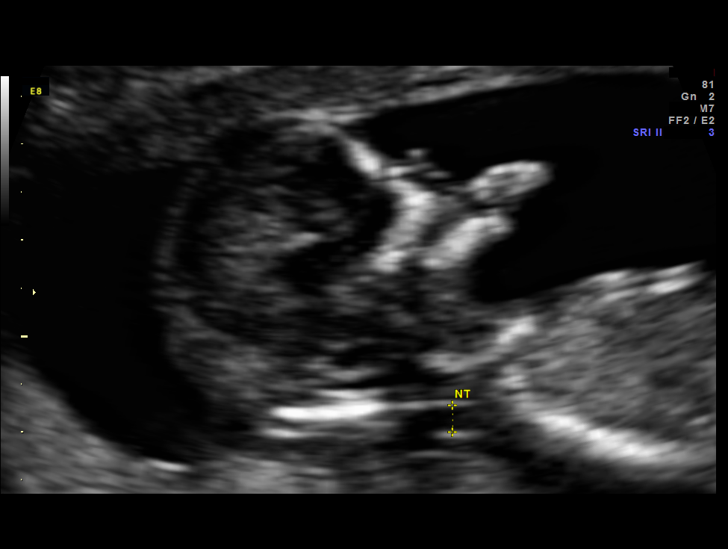
[im 4/30]
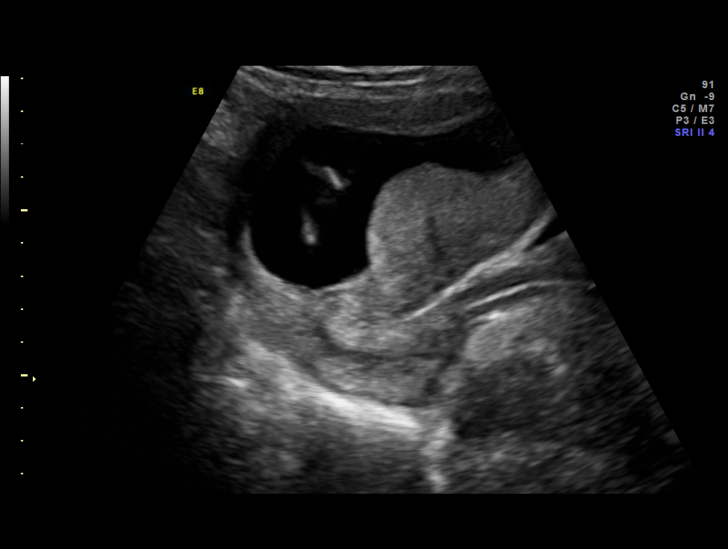
[im 6/30]
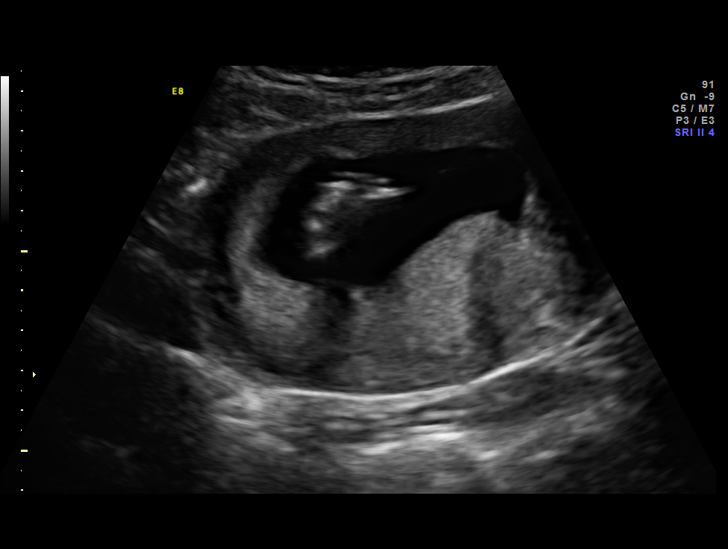
[im 8/30]
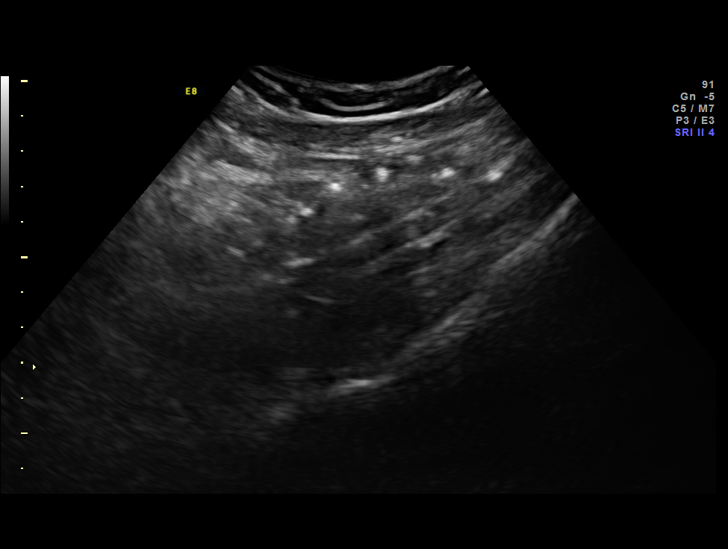
[im 10/30]
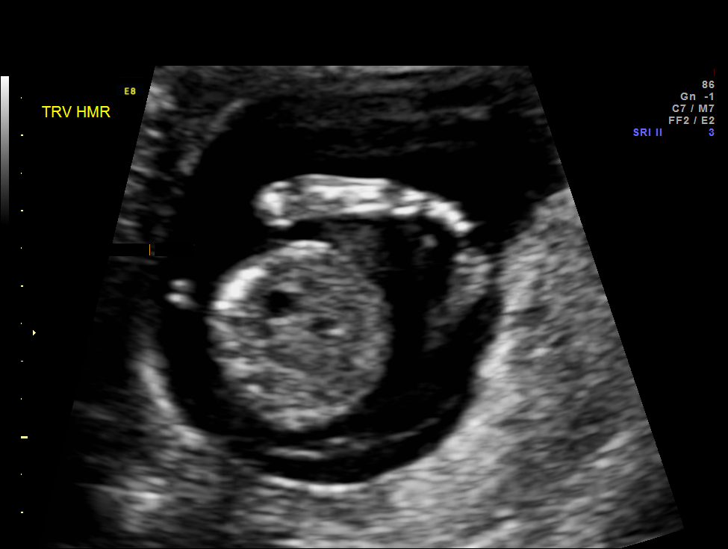
[im 12/30]
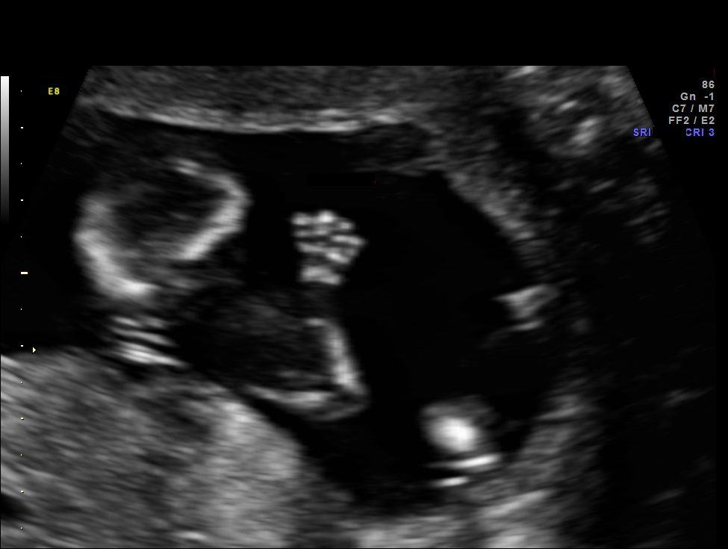
[im 16/30]
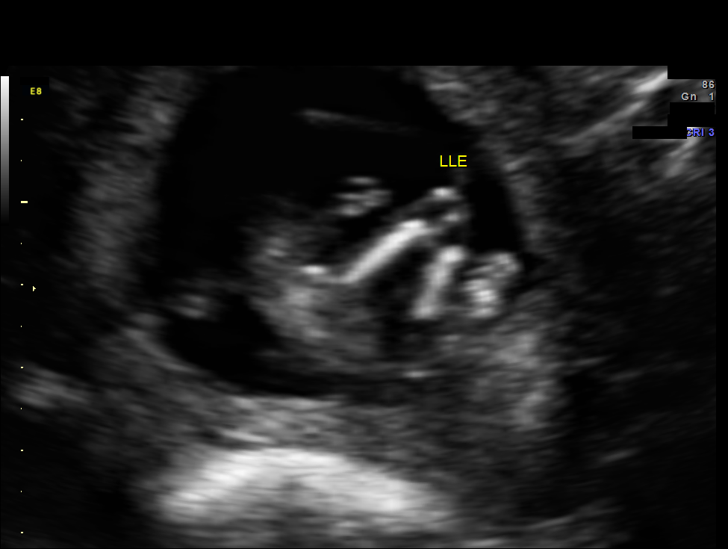
[im 18/30]
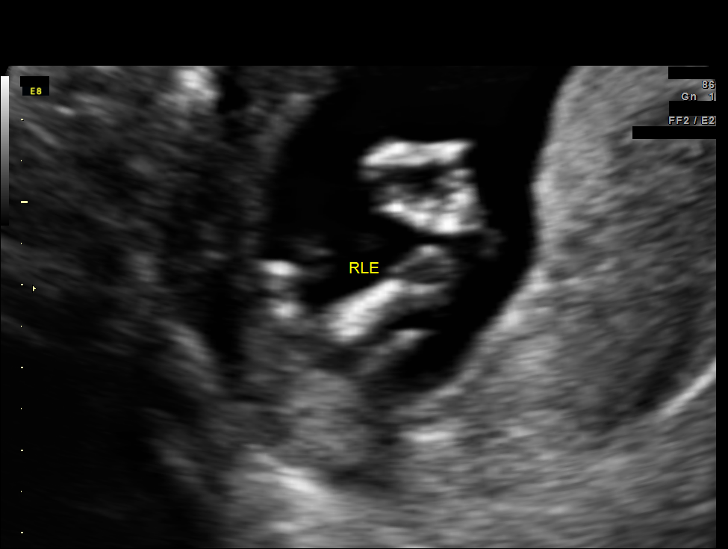
[im 20/30]
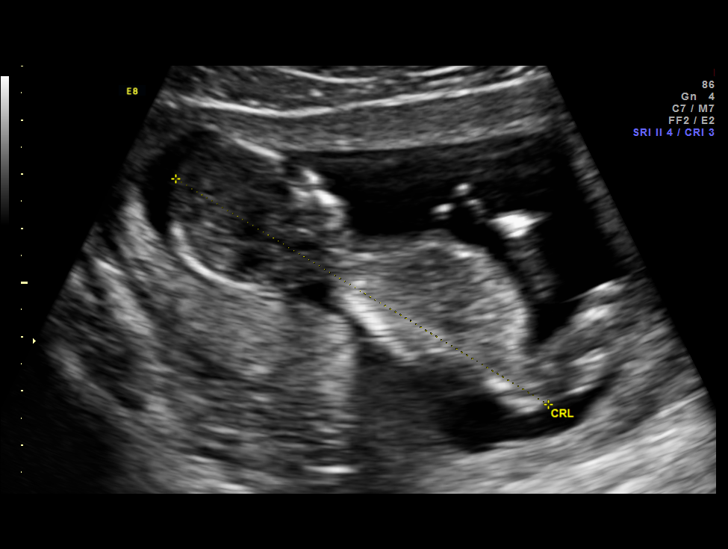
[im 22/30]
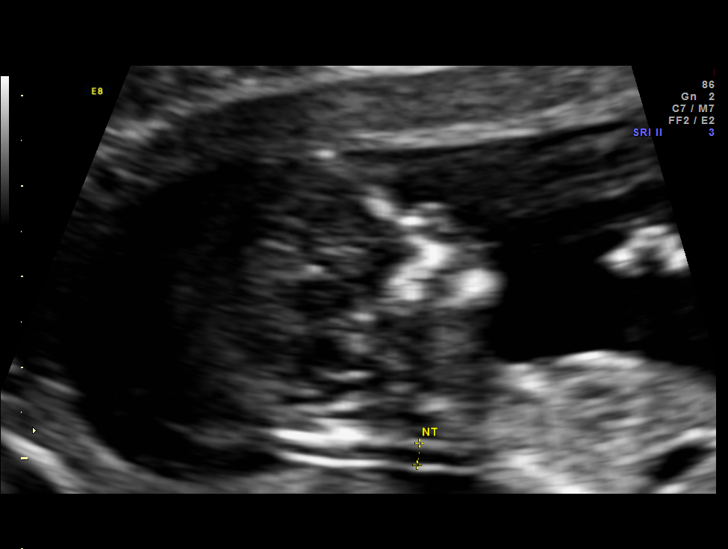
[im 24/30]
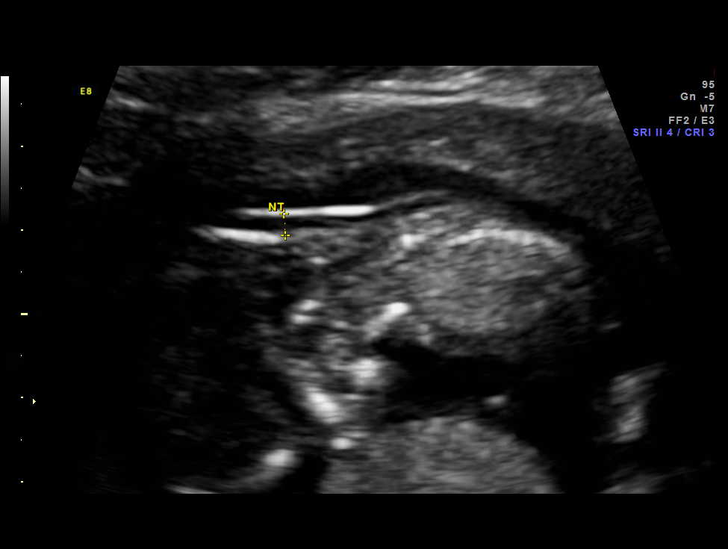
[im 26/30]
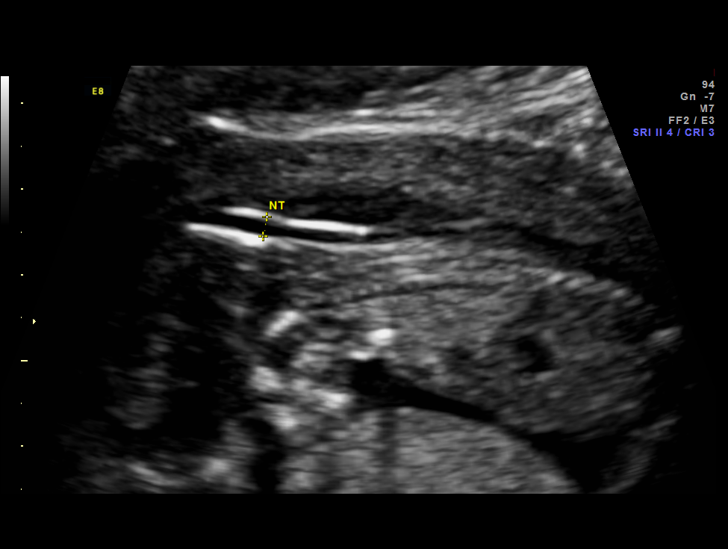
[im 28/30]
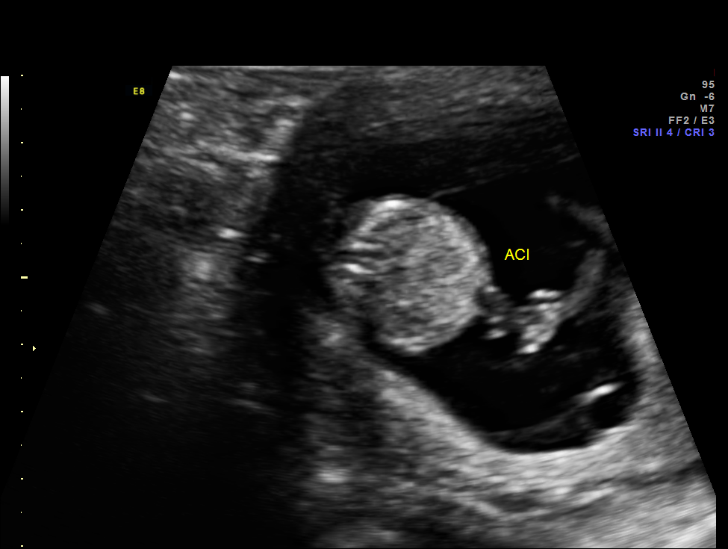

[13 of 28 positions shown; findings below may reference images not displayed]

OBSTETRICS REPORT
                      (Signed Final 12/31/2013 [DATE])

Service(s) Provided

 US FETAL NUCHAL TRANSLUCENCY                          76813.0
 MEASUREMENT
Indications

 First trimester aneuploidy screen (NT)                Z36
 13 weeks gestation of pregnancy
Fetal Evaluation

 Num Of Fetuses:    1
 Preg. Location:    Intrauterine
 Fetal Heart Rate:  161                          bpm
 Cardiac Activity:  Observed
 Presentation:      Transverse, head to
                    maternal right
 Placenta:          Anterior

 Amniotic Fluid
 AFI FV:      Subjectively within normal limits
Gestational Age

 LMP:           13w 1d        Date:  09/30/13                 EDD:   07/07/14
 Best:          13w 1d     Det. By:  LMP  (09/30/13)          EDD:   07/07/14
1st Trimester Genetic Sonogram Screening

 CRL:            80.9  mm    G. Age:   13w 5d                 EDD:   07/03/14
 Nuc Trans:       2.4  mm
 Nasal Bone:                 Present
Anatomy

 Cranium:          Appears normal         Cord Vessels:     Appears normal (3
                                                            vessel cord)
 Choroid Plexus:   Appears normal         Bladder:          Appears normal
 Stomach:          Appears normal, left   Lower             Visualized
                   sided                  Extremities:
 Abdominal Wall:   Appears nml (cord      Upper             Visualized
                   insert, abd wall)      Extremities:
Cervix Uterus Adnexa
 Cervix:       Normal appearance by transabdominal scan.

 Adnexa:     No abnormality visualized.
Impression

 Single IUP at 13w 5d
 NT 2.4 mm.  Nasal bone visualized.
 First trimester aneuploidy screen performed as noted above.
Recommendations

 Please do not draw triple/quad screen, though patient should
 be offered MSAFP for neural tube defect screening.
 Recommend ultrasound for fetal anatomy at 18-20 weeks
 gestation.

 questions or concerns.

## 2016-06-15 IMAGING — US US OB FOLLOW-UP
1 series · 12 of 28 positions shown · non-contrast
Comparison: none

[Series 1: us ob follow-up · 0.13mm/px · 94 acquisitions, 12 frames shown]
[im 4/94]
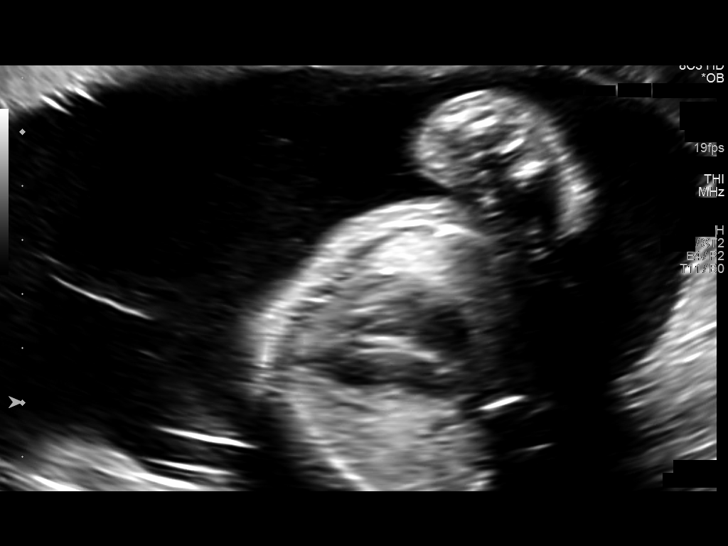
[im 11/94]
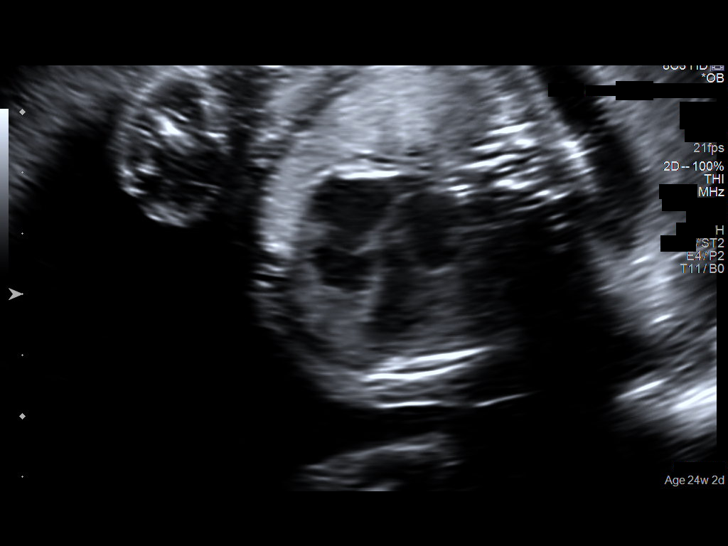
[im 18/94]
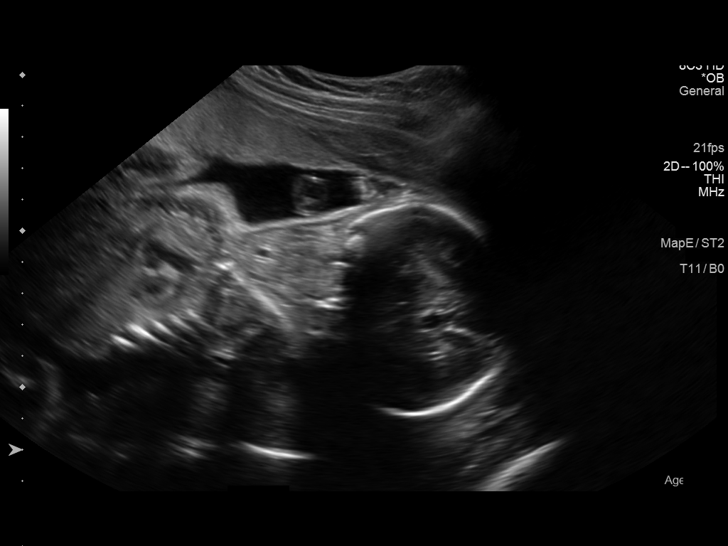
[im 28/94]
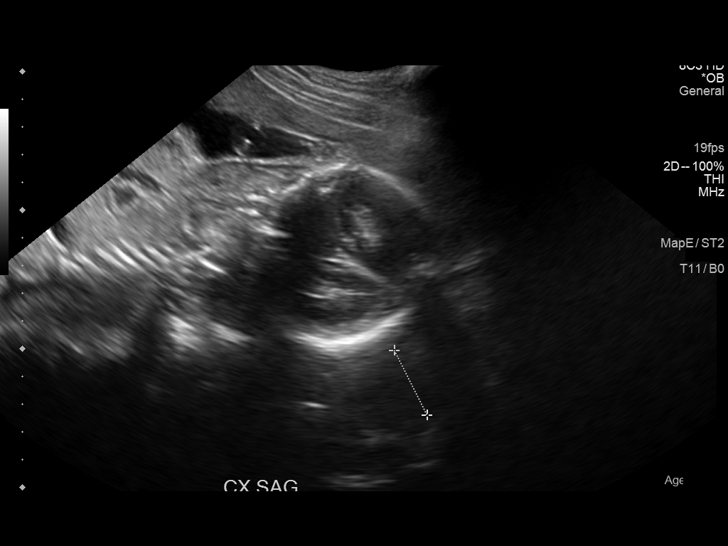
[im 35/94]
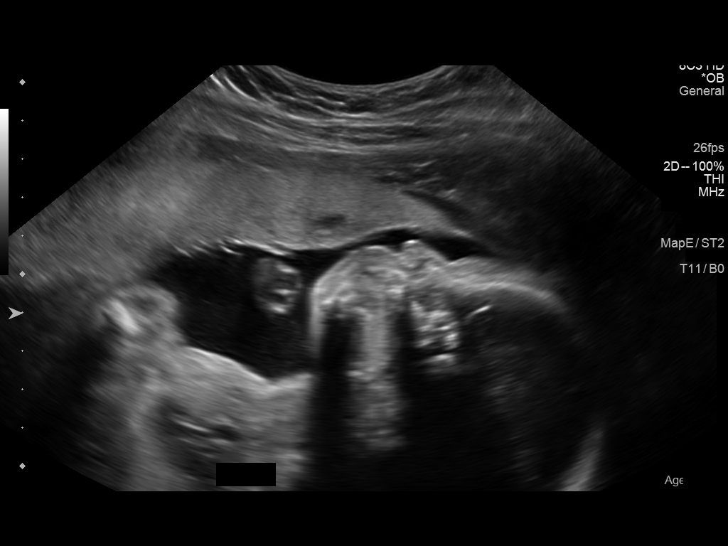
[im 42/94]
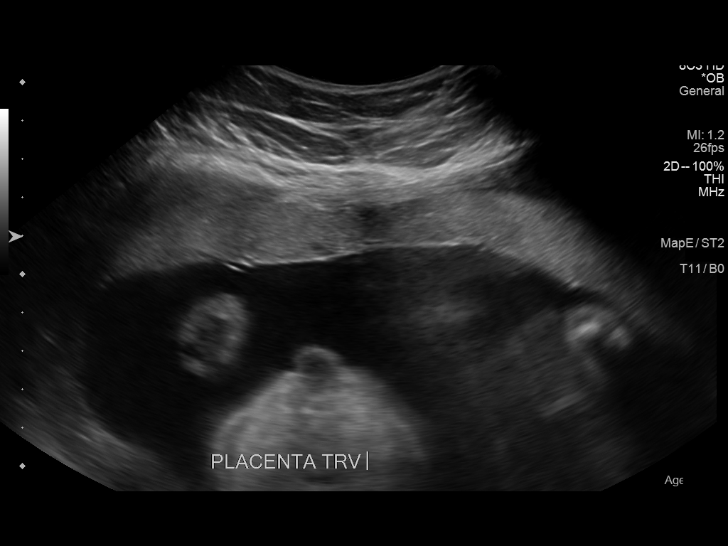
[im 52/94]
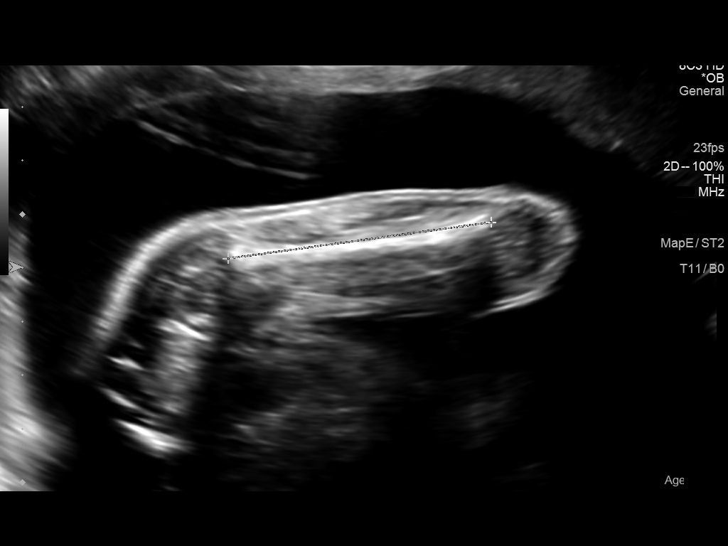
[im 59/94]
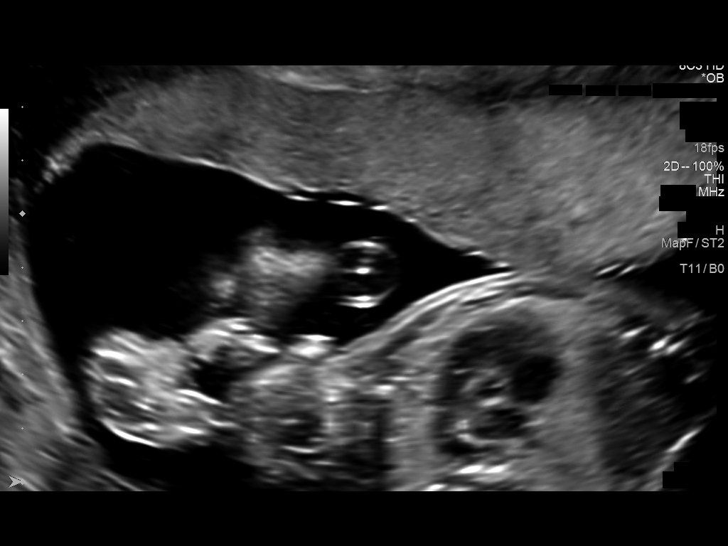
[im 66/94]
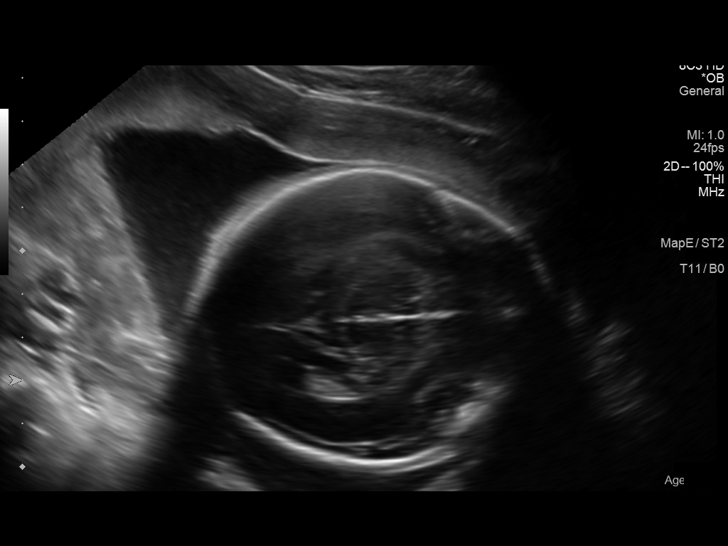
[im 76/94]
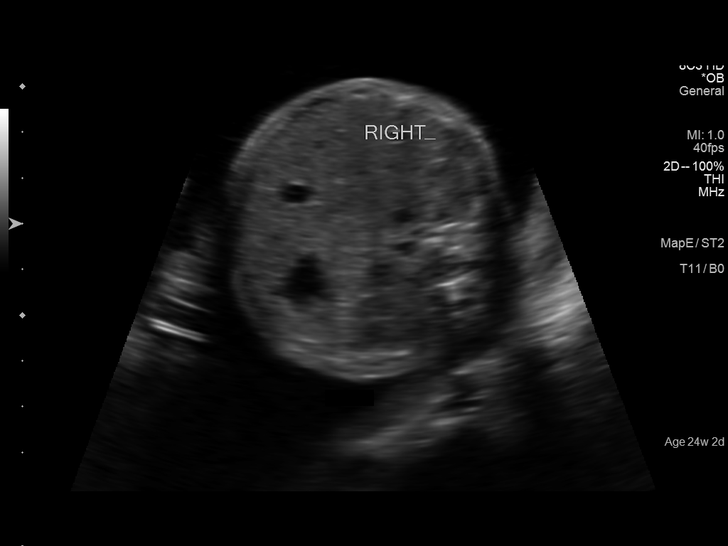
[im 83/94]
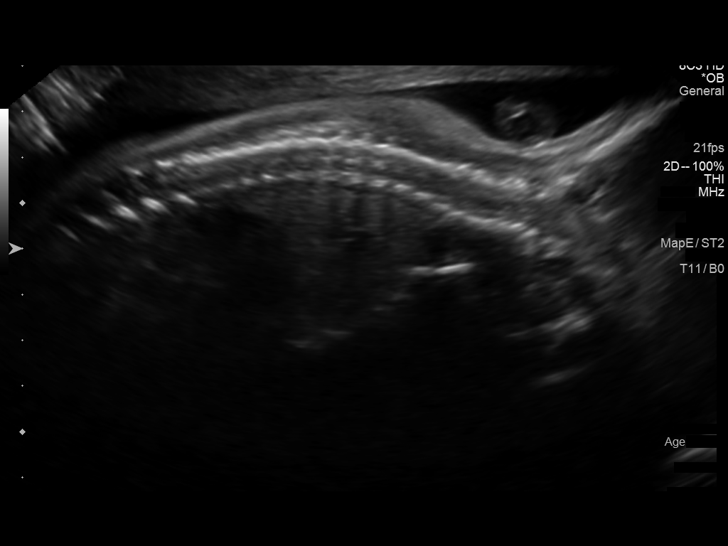
[im 90/94]
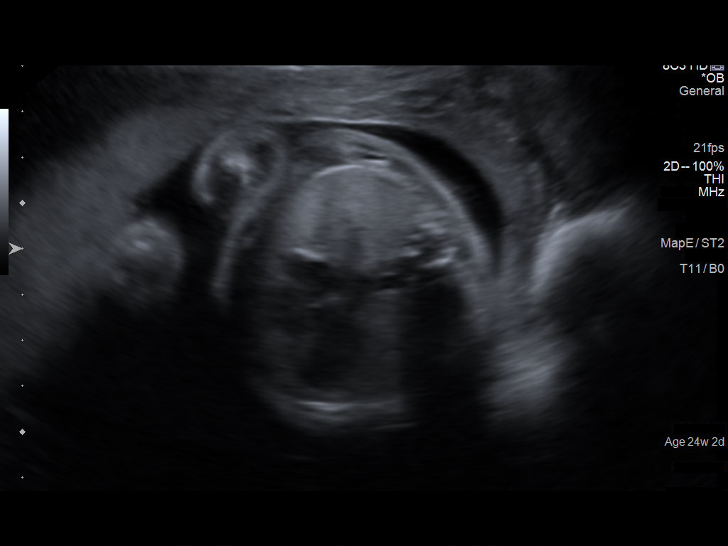

[12 of 28 positions shown; findings below may reference images not displayed]

OBSTETRICS REPORT
                      (Signed Final 03/19/2014 [DATE])

Service(s) Provided

 US OB FOLLOW UP                                       76816.1
Indications

 Follow-up incomplete fetal anatomic evaluation        Z36
 24 weeks gestation of pregnancy
Fetal Evaluation

 Num Of Fetuses:    1
 Fetal Heart Rate:  152                          bpm
 Cardiac Activity:  Observed
 Presentation:      Cephalic
 Placenta:          Anterior, above cervical os
 P. Cord            Previously Visualized
 Insertion:

 Amniotic Fluid
 AFI FV:      Subjectively within normal limits
                                             Larg Pckt:     8.3  cm
Biometry

 BPD:       68  mm     G. Age:  27w 3d                CI:        83.45   70 - 86
                                                      FL/HC:      20.7   18.7 -

 HC:     234.7  mm     G. Age:  25w 3d       76  %    HC/AC:      1.12   1.05 -

 AC:     210.2  mm     G. Age:  25w 4d       79  %    FL/BPD:     71.5   71 - 87
 FL:      48.6  mm     G. Age:  26w 2d       91  %    FL/AC:      23.1   20 - 24

 Est. FW:     870  gm    1 lb 15 oz      78  %
Gestational Age

 LMP:           24w 2d        Date:  09/30/13                 EDD:   07/07/14
 U/S Today:     26w 1d                                        EDD:   06/24/14
 Best:          24w 2d     Det. By:  LMP  (09/30/13)          EDD:   07/07/14
Anatomy

 Cranium:          Appears normal         Aortic Arch:      Appears normal
 Fetal Cavum:      Appears normal         Ductal Arch:      Appears normal
 Ventricles:       Appears normal         Diaphragm:        Appears normal
 Choroid Plexus:   Appears normal         Stomach:          Appears normal, left
                                                            sided
 Cerebellum:       Previously seen        Abdomen:          Appears normal
 Posterior Fossa:  Previously seen        Abdominal Wall:   Appears nml (cord
                                                            insert, abd wall)
 Nuchal Fold:      Previously seen        Cord Vessels:     Previously seen
 Face:             Appears normal         Kidneys:          Appear normal
                   (orbits and profile)
 Lips:             Appears normal         Bladder:          Appears normal
 Heart:            Appears normal         Spine:            Appears normal
                   (4CH, axis, and
                   situs)
 RVOT:             Appears normal         Lower             Previously seen
                                          Extremities:
 LVOT:             Appears normal         Upper             Previously seen
                                          Extremities:

 Other:  Male gender. Heels previously visualized. Technically difficult due to
         fetal position.
Cervix Uterus Adnexa

 Cervical Length:    3.3      cm

 Cervix:       Normal appearance by transabdominal scan.

 Left Ovary:    Within normal limits.
 Right Ovary:   Not visualized.
 Adnexa:     No abnormality visualized.
Impression

 SIUP at 35w3d
 EFW 78th%'le
 No dysmorphic features, fetal survey is now complete
 No previa
Recommendations

 Follow up as clinically indicated.
